# Patient Record
Sex: Female | Born: 1965 | Race: White | Hispanic: Yes | Marital: Married | Smoking: Never smoker
Health system: Southern US, Community
[De-identification: ages and names within clinical notes are randomized; demographics above are authoritative.]

## PROBLEM LIST (undated history)

## (undated) DIAGNOSIS — K219 Gastro-esophageal reflux disease without esophagitis: Secondary | ICD-10-CM

## (undated) DIAGNOSIS — I10 Essential (primary) hypertension: Secondary | ICD-10-CM

## (undated) DIAGNOSIS — E119 Type 2 diabetes mellitus without complications: Secondary | ICD-10-CM

---

## 2004-07-08 ENCOUNTER — Ambulatory Visit (HOSPITAL_COMMUNITY): Admission: RE | Admit: 2004-07-08 | Discharge: 2004-07-08 | Payer: Self-pay | Admitting: General Surgery

## 2004-10-03 ENCOUNTER — Inpatient Hospital Stay (HOSPITAL_COMMUNITY): Admission: AD | Admit: 2004-10-03 | Discharge: 2004-10-04 | Payer: Self-pay | Admitting: Obstetrics and Gynecology

## 2005-06-27 ENCOUNTER — Ambulatory Visit (HOSPITAL_COMMUNITY): Admission: AD | Admit: 2005-06-27 | Discharge: 2005-06-27 | Payer: Self-pay | Admitting: Obstetrics and Gynecology

## 2005-07-04 ENCOUNTER — Ambulatory Visit (HOSPITAL_COMMUNITY): Admission: AD | Admit: 2005-07-04 | Discharge: 2005-07-04 | Payer: Self-pay | Admitting: Obstetrics and Gynecology

## 2005-07-11 ENCOUNTER — Ambulatory Visit (HOSPITAL_COMMUNITY): Admission: AD | Admit: 2005-07-11 | Discharge: 2005-07-11 | Payer: Self-pay | Admitting: Obstetrics and Gynecology

## 2005-07-18 ENCOUNTER — Ambulatory Visit (HOSPITAL_COMMUNITY): Admission: AD | Admit: 2005-07-18 | Discharge: 2005-07-18 | Payer: Self-pay | Admitting: Obstetrics and Gynecology

## 2005-07-25 ENCOUNTER — Ambulatory Visit (HOSPITAL_COMMUNITY): Admission: AD | Admit: 2005-07-25 | Discharge: 2005-07-25 | Payer: Self-pay | Admitting: Obstetrics and Gynecology

## 2005-07-30 ENCOUNTER — Ambulatory Visit (HOSPITAL_COMMUNITY): Admission: AD | Admit: 2005-07-30 | Discharge: 2005-07-30 | Payer: Self-pay | Admitting: Obstetrics and Gynecology

## 2005-08-01 ENCOUNTER — Ambulatory Visit (HOSPITAL_COMMUNITY): Admission: AD | Admit: 2005-08-01 | Discharge: 2005-08-01 | Payer: Self-pay | Admitting: Obstetrics and Gynecology

## 2005-08-06 ENCOUNTER — Encounter: Payer: Self-pay | Admitting: Obstetrics & Gynecology

## 2005-08-06 ENCOUNTER — Inpatient Hospital Stay (HOSPITAL_COMMUNITY): Admission: AD | Admit: 2005-08-06 | Discharge: 2005-08-10 | Payer: Self-pay | Admitting: Obstetrics & Gynecology

## 2010-01-29 ENCOUNTER — Emergency Department (HOSPITAL_COMMUNITY): Admission: EM | Admit: 2010-01-29 | Discharge: 2010-01-29 | Payer: Self-pay | Admitting: Emergency Medicine

## 2010-04-22 ENCOUNTER — Ambulatory Visit (HOSPITAL_COMMUNITY): Admission: RE | Admit: 2010-04-22 | Discharge: 2010-04-22 | Payer: Self-pay | Admitting: Family Medicine

## 2010-06-22 ENCOUNTER — Encounter: Payer: Self-pay | Admitting: General Surgery

## 2010-06-23 ENCOUNTER — Encounter: Payer: Self-pay | Admitting: General Surgery

## 2010-10-18 NOTE — Discharge Summary (Signed)
NAME:  Rebecca Ewing, Rebecca Ewing     ACCOUNT NO.:  [id number]   MEDICAL RECORD NO.:  [id number]          PATIENT TYPE:  INP   LOCATION:  A413                          FACILITY:  [facility]   PHYSICIAN:  Tilda Burrow, M.D. DATE OF BIRTH:  [birth year]   DATE OF ADMISSION:  08/06/2005  DATE OF DISCHARGE:  03/11/2007LH                                 DISCHARGE SUMMARY   ADMISSION DIAGNOSES:  1.  Pregnancy at 37 weeks' gestation.  2.  Preeclampsia, nonreassuring fetal status.   HISTORY OF PRESENT ILLNESS:  This is a 45 year old female, G2, P1 with prior  fetal demise in utero at 26 weeks for unknown reason.  She has been followed  through the office with pregnancy notable for size greater than dates with  borderline elevation of blood sugars.  She had a 1-hour glucose tolerance  test at 205 mg/percent with normal 3-hour glucose tolerance test.  She  subsequently did not show any glucosuria.  Hemoglobin A1c was normal at 6.1.  She is admitted at 24 weeks' gestation after presenting with blood pressure  elevations to the office.  Blood pressure was 170/90 with 2+ proteinuria.  She was admitted with plans to proceed towards cesarean delivery due to  unfavorable cervix.  Fetal monitoring showed nonreassuring fetal status, so  the patient was taken to the operating room for cesarean delivery.  See Dr.  Forestine Chute notes regarding cesarean delivery.   HOSPITAL COURSE:  The patient was admitted from the office with  nonreassuring fetal tracing performed.  Cesarean delivery performed finding  a large-framed female infant, Apgar's 6 and 9.  Blood gas pH on the baby was  7.12, pCO2 82.0 with pO2 14, bicarb 25.6, base excess 2.7.  There was thick  meconium which responded well to suctioning.   Maternal hemoglobin on admission was 12.1, hematocrit 35.7.  The patient had  a relatively uncomplicated postoperative course.  The baby required  supportive care with additional time in the nursery.  Mother was  converted  to hotel status on August 10, 2005, for one additional day with probable  discharge on August 11, 2005.  Removal of staples and subcutaneous Al Pimple drain was performed on the day of discharge, August 10, 2005.   FOLLOW UP:  Follow up in 1 week in our office for removal of remainder of  staples.      Tilda Burrow, M.D.  Electronically Signed     JVF/MEDQ  D:  08/10/2005  T:  08/11/2005  Job:  [id number]   cc:   [facility]

## 2010-10-18 NOTE — Op Note (Signed)
NAME:  Rebecca Ewing, Rebecca Ewing     ACCOUNT NO.:  [id number]   MEDICAL RECORD NO.:  [id number]          PATIENT TYPE:  INP   LOCATION:  A413                          FACILITY:  [facility]   PHYSICIAN:  Lazaro Arms, M.D.   DATE OF BIRTH:  07/24/1965   DATE OF PROCEDURE:  08/06/2005  DATE OF DISCHARGE:                                 OPERATIVE REPORT   PREOPERATIVE DIAGNOSES:  1.  Intrauterine pregnancy at 37-4/7 weeks' gestation.  2.  Probable preeclampsia.  3.  Advanced maternal age.  4.  Nonreassuring fetal heart rate tracing.  5.  History of intrauterine fetal demise.   POSTOPERATIVE DIAGNOSES:  1.  Intrauterine pregnancy at 37-4/7 weeks' gestation.  2.  Probable preeclampsia.  3.  Advanced maternal age.  4.  Nonreassuring fetal heart rate tracing.  5.  History of intrauterine fetal demise.  6.  Thick meconium amniotic fluid.   PROCEDURE:  Primary low transverse Cesarean section.   SURGEON:  Lazaro Arms, M.D.   ANESTHESIA:  Spinal.   FINDINGS:  Over a low transverse hysterotomy incision was delivered a viable  female. Apgars of 6 of 9 with weight to be determined in the nursery. The  infant was in the vertex presentation. There was a three-vessel cord. Cord  blood and cord gas were sent. Uterus, tubes and ovaries were normal.  Placenta was sent to pathology for evaluation. Dr. Milinda Cave was in attendance  for routine neonatal resuscitation. At the time of delivery, it was found  that the infant had thick meconium amniotic fluid, and I did a DeLee gastric  lavage x3 prior to delivery. Cord blood and cord gas were sent.   DESCRIPTION OF OPERATION:  The patient was taken to the operating room and  placed in the sitting position where she underwent a spinal anesthetic. She  was then placed in the dorsal lithotomy position with a bump in her right  hip. She was prepped and draped in usual sterile fashion. A Pfannenstiel  skin incision was made and carried down sharply to the  rectus fascia which  was scored in the midline and extended laterally. The fascia was taken off  of the muscles superiorly and inferiorly without difficulty. The muscles  were divided. The peritoneal cavity was entered. A bladder blade was placed,  and the vesicouterine serosal flap was created. A low transverse hysterotomy  incision was made, and over this incision was delivered a viable female infant  at 40 with Apgars of 6 and 9. There was a three-vessel cord. Cord blood  and cord gas were sent. The infant underwent gastric DeLee suction x3 with  approximately 6 to 7 cc of thick amniotic fluid returned. The infant was not  stimulated, and no oral nasal suctioning was performed, and the infant was  handed to Dr. Milinda Cave who was in attendance for routine neonatal  resuscitation. Dr. Milinda Cave did look below the cords and did not see anything  at that point. The uterus was exteriorized. Placenta was delivered  spontaneously and sent to pathology for evaluation. The uterus was closed in  two layers, the first being a running interlocking layer, the second being  an imbricating layer. Multiple other interrupted sutures were placed for  hemostasis. The uterus was replaced into the peritoneal cavity. The pelvis  was irrigated vigorously. All pedicles found to be hemostatic. The muscles  and peritoneum were reapproximated loosely. The fascia closed using 0 Vicryl  running. Subcutaneous tissue was made hemostatic and irrigated. The skin was  closed using skin staples. The patient tolerated the procedure well. She  experienced 750 cc of blood loss from my estimation. She received Ancef  after the cord was clamped as well as Pitocin. A JP drain was placed in the  subcutaneous tissue and anchored in the right lower quadrant where it  exited, and again the skin was closed using skin staples.      Lazaro Arms, M.D.  Electronically Signed     LHE/MEDQ  D:  08/06/2005  T:  08/07/2005  Job:   [id number]

## 2010-10-18 NOTE — Op Note (Signed)
NAME:  Rebecca Ewing, LOVAN     ACCOUNT NO.:  [id number]   MEDICAL RECORD NO.:  [id number]          PATIENT TYPE:  INP   LOCATION:  LDR4                          FACILITY:  [facility]   PHYSICIAN:  Lazaro Arms, M.D.   DATE OF BIRTH:  1966-05-30   DATE OF PROCEDURE:  10/04/2004  DATE OF DISCHARGE:                                 OPERATIVE REPORT   DELIVERY NOTE:  Jaedah got up to use the bathroom and experienced a  moderate amount of vaginal bleeding. We walked her back to the bed where  upon lying down the baby just basically plopped out. Time of birth was  08:45. Apgars were 0, 0. Weight is pending. There is obvious deformity to  the fetal head. The parents are going to get an autopsy before cremation. At  this time, 100 units of Pitocin has been injected into the umbilical vein.  She has 20 units of pitocin diluted in 1,000 cc of lactated ringers,  infusing rapidly IV. We are awaiting delivery of the placenta. There is no  bleeding at this time. The parents are appropriately grieving and bonding  with the baby.      FC/MEDQ  D:  10/04/2004  T:  10/04/2004  Job:  [id number]

## 2011-04-22 ENCOUNTER — Other Ambulatory Visit (HOSPITAL_COMMUNITY): Payer: Self-pay | Admitting: Family Medicine

## 2011-04-22 DIAGNOSIS — Z139 Encounter for screening, unspecified: Secondary | ICD-10-CM

## 2011-04-25 ENCOUNTER — Ambulatory Visit (HOSPITAL_COMMUNITY)
Admission: RE | Admit: 2011-04-25 | Discharge: 2011-04-25 | Disposition: A | Discharge status: Home / Self Care | Payer: Self-pay | Source: Ambulatory Visit | Attending: Family Medicine | Admitting: Family Medicine

## 2011-04-25 DIAGNOSIS — Z139 Encounter for screening, unspecified: Secondary | ICD-10-CM

## 2012-04-23 ENCOUNTER — Other Ambulatory Visit (HOSPITAL_COMMUNITY): Payer: Self-pay | Admitting: Nurse Practitioner

## 2012-04-23 DIAGNOSIS — N6310 Unspecified lump in the right breast, unspecified quadrant: Secondary | ICD-10-CM

## 2012-04-23 DIAGNOSIS — Z139 Encounter for screening, unspecified: Secondary | ICD-10-CM

## 2012-04-26 ENCOUNTER — Ambulatory Visit (HOSPITAL_COMMUNITY): Payer: PRIVATE HEALTH INSURANCE

## 2012-04-28 ENCOUNTER — Ambulatory Visit (HOSPITAL_COMMUNITY)
Admission: RE | Admit: 2012-04-28 | Discharge: 2012-04-28 | Disposition: A | Discharge status: Home / Self Care | Payer: PRIVATE HEALTH INSURANCE | Source: Ambulatory Visit | Attending: Nurse Practitioner | Admitting: Nurse Practitioner

## 2012-04-28 DIAGNOSIS — N6310 Unspecified lump in the right breast, unspecified quadrant: Secondary | ICD-10-CM

## 2012-04-28 DIAGNOSIS — N63 Unspecified lump in unspecified breast: Secondary | ICD-10-CM | POA: Insufficient documentation

## 2013-05-10 ENCOUNTER — Other Ambulatory Visit (HOSPITAL_COMMUNITY): Payer: Self-pay | Admitting: Nurse Practitioner

## 2013-05-10 DIAGNOSIS — Z139 Encounter for screening, unspecified: Secondary | ICD-10-CM

## 2013-05-16 ENCOUNTER — Ambulatory Visit (HOSPITAL_COMMUNITY)
Admission: RE | Admit: 2013-05-16 | Discharge: 2013-05-16 | Disposition: A | Discharge status: Home / Self Care | Payer: Self-pay | Source: Ambulatory Visit | Attending: Nurse Practitioner | Admitting: Nurse Practitioner

## 2013-05-16 DIAGNOSIS — Z139 Encounter for screening, unspecified: Secondary | ICD-10-CM

## 2014-06-22 ENCOUNTER — Other Ambulatory Visit (HOSPITAL_COMMUNITY): Payer: Self-pay | Admitting: Nurse Practitioner

## 2014-06-22 DIAGNOSIS — Z1231 Encounter for screening mammogram for malignant neoplasm of breast: Secondary | ICD-10-CM

## 2014-07-03 ENCOUNTER — Ambulatory Visit (HOSPITAL_COMMUNITY)
Admission: RE | Admit: 2014-07-03 | Discharge: 2014-07-03 | Disposition: A | Discharge status: Home / Self Care | Payer: Self-pay | Source: Ambulatory Visit | Attending: Nurse Practitioner | Admitting: Nurse Practitioner

## 2014-07-03 DIAGNOSIS — Z1231 Encounter for screening mammogram for malignant neoplasm of breast: Secondary | ICD-10-CM

## 2015-07-06 ENCOUNTER — Other Ambulatory Visit (HOSPITAL_COMMUNITY): Payer: Self-pay | Admitting: Nurse Practitioner

## 2015-07-06 DIAGNOSIS — Z1231 Encounter for screening mammogram for malignant neoplasm of breast: Secondary | ICD-10-CM

## 2015-07-16 ENCOUNTER — Ambulatory Visit (HOSPITAL_COMMUNITY)
Admission: RE | Admit: 2015-07-16 | Discharge: 2015-07-16 | Disposition: A | Discharge status: Home / Self Care | Payer: Self-pay | Source: Ambulatory Visit | Attending: Nurse Practitioner | Admitting: Nurse Practitioner

## 2015-07-16 DIAGNOSIS — Z1231 Encounter for screening mammogram for malignant neoplasm of breast: Secondary | ICD-10-CM

## 2016-07-07 ENCOUNTER — Other Ambulatory Visit (HOSPITAL_COMMUNITY): Payer: Self-pay | Admitting: Nurse Practitioner

## 2016-07-07 DIAGNOSIS — Z1231 Encounter for screening mammogram for malignant neoplasm of breast: Secondary | ICD-10-CM

## 2016-07-21 ENCOUNTER — Ambulatory Visit (HOSPITAL_COMMUNITY)
Admission: RE | Admit: 2016-07-21 | Discharge: 2016-07-21 | Disposition: A | Discharge status: Home / Self Care | Payer: Self-pay | Source: Ambulatory Visit | Attending: Nurse Practitioner | Admitting: Nurse Practitioner

## 2016-07-21 DIAGNOSIS — Z1231 Encounter for screening mammogram for malignant neoplasm of breast: Secondary | ICD-10-CM

## 2017-12-22 ENCOUNTER — Other Ambulatory Visit (HOSPITAL_COMMUNITY): Payer: Self-pay | Admitting: *Deleted

## 2017-12-22 DIAGNOSIS — Z1231 Encounter for screening mammogram for malignant neoplasm of breast: Secondary | ICD-10-CM

## 2018-01-04 ENCOUNTER — Ambulatory Visit (HOSPITAL_COMMUNITY)
Admission: RE | Admit: 2018-01-04 | Discharge: 2018-01-04 | Disposition: A | Discharge status: Home / Self Care | Payer: PRIVATE HEALTH INSURANCE | Source: Ambulatory Visit | Attending: *Deleted | Admitting: *Deleted

## 2018-01-04 DIAGNOSIS — Z1231 Encounter for screening mammogram for malignant neoplasm of breast: Secondary | ICD-10-CM | POA: Diagnosis present

## 2018-12-13 ENCOUNTER — Other Ambulatory Visit (HOSPITAL_COMMUNITY): Payer: Self-pay | Admitting: Nurse Practitioner

## 2018-12-13 DIAGNOSIS — Z1231 Encounter for screening mammogram for malignant neoplasm of breast: Secondary | ICD-10-CM

## 2019-01-17 ENCOUNTER — Other Ambulatory Visit: Payer: Self-pay

## 2019-01-17 ENCOUNTER — Ambulatory Visit (HOSPITAL_COMMUNITY)
Admission: RE | Admit: 2019-01-17 | Discharge: 2019-01-17 | Disposition: A | Discharge status: Home / Self Care | Payer: Self-pay | Source: Ambulatory Visit | Attending: Nurse Practitioner | Admitting: Nurse Practitioner

## 2019-01-17 ENCOUNTER — Encounter (HOSPITAL_COMMUNITY): Payer: Self-pay

## 2019-01-17 ENCOUNTER — Ambulatory Visit (HOSPITAL_COMMUNITY): Payer: Self-pay

## 2019-01-17 DIAGNOSIS — Z1231 Encounter for screening mammogram for malignant neoplasm of breast: Secondary | ICD-10-CM | POA: Insufficient documentation

## 2019-02-08 ENCOUNTER — Other Ambulatory Visit (HOSPITAL_COMMUNITY): Payer: Self-pay | Admitting: *Deleted

## 2019-02-08 DIAGNOSIS — R928 Other abnormal and inconclusive findings on diagnostic imaging of breast: Secondary | ICD-10-CM

## 2019-02-08 DIAGNOSIS — N6310 Unspecified lump in the right breast, unspecified quadrant: Secondary | ICD-10-CM

## 2019-02-22 ENCOUNTER — Ambulatory Visit (HOSPITAL_COMMUNITY)
Admission: RE | Admit: 2019-02-22 | Discharge: 2019-02-22 | Disposition: A | Discharge status: Home / Self Care | Payer: PRIVATE HEALTH INSURANCE | Source: Ambulatory Visit | Attending: *Deleted | Admitting: *Deleted

## 2019-02-22 ENCOUNTER — Other Ambulatory Visit (HOSPITAL_COMMUNITY): Payer: Self-pay

## 2019-02-22 ENCOUNTER — Other Ambulatory Visit: Payer: Self-pay

## 2019-02-22 DIAGNOSIS — R928 Other abnormal and inconclusive findings on diagnostic imaging of breast: Secondary | ICD-10-CM

## 2019-02-22 DIAGNOSIS — N6310 Unspecified lump in the right breast, unspecified quadrant: Secondary | ICD-10-CM | POA: Diagnosis present

## 2019-08-22 ENCOUNTER — Other Ambulatory Visit (HOSPITAL_COMMUNITY): Payer: Self-pay | Admitting: *Deleted

## 2019-08-22 DIAGNOSIS — N6310 Unspecified lump in the right breast, unspecified quadrant: Secondary | ICD-10-CM

## 2019-10-25 ENCOUNTER — Other Ambulatory Visit: Payer: Self-pay

## 2019-10-25 ENCOUNTER — Ambulatory Visit (HOSPITAL_COMMUNITY)
Admission: RE | Admit: 2019-10-25 | Discharge: 2019-10-25 | Disposition: A | Discharge status: Home / Self Care | Payer: PRIVATE HEALTH INSURANCE | Source: Ambulatory Visit | Attending: *Deleted | Admitting: *Deleted

## 2019-10-25 ENCOUNTER — Other Ambulatory Visit (HOSPITAL_COMMUNITY): Payer: PRIVATE HEALTH INSURANCE

## 2019-10-25 DIAGNOSIS — N6310 Unspecified lump in the right breast, unspecified quadrant: Secondary | ICD-10-CM

## 2019-10-27 ENCOUNTER — Other Ambulatory Visit (HOSPITAL_COMMUNITY): Payer: Self-pay

## 2022-01-05 IMAGING — US US BREAST*R* LIMITED INC AXILLA
1 series · 7 of 7 positions shown · non-contrast
Comparison: Previous exams.

CLINICAL DATA: Short-term follow-up for probably benign right
breast mass.

EXAM:
ULTRASOUND OF THE RIGHT BREAST

[Series 1: us breast*right* limited inc axilla · 0.05mm/px · 7 of 7 slices shown]
[im 1/7]
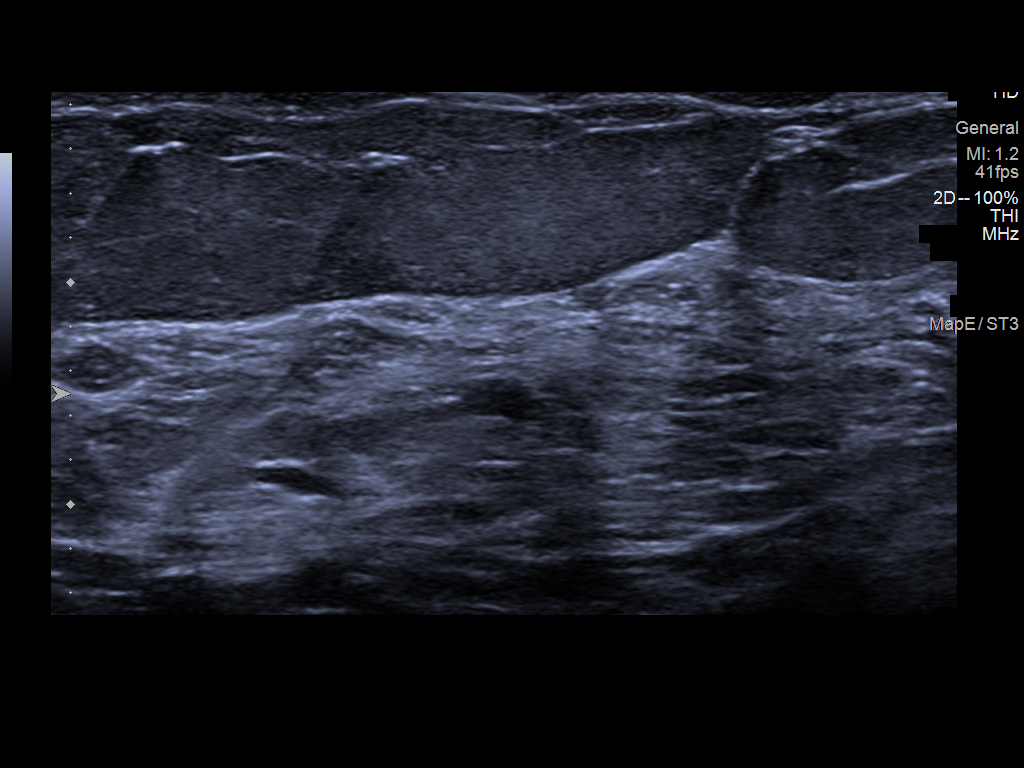
[im 2/7]
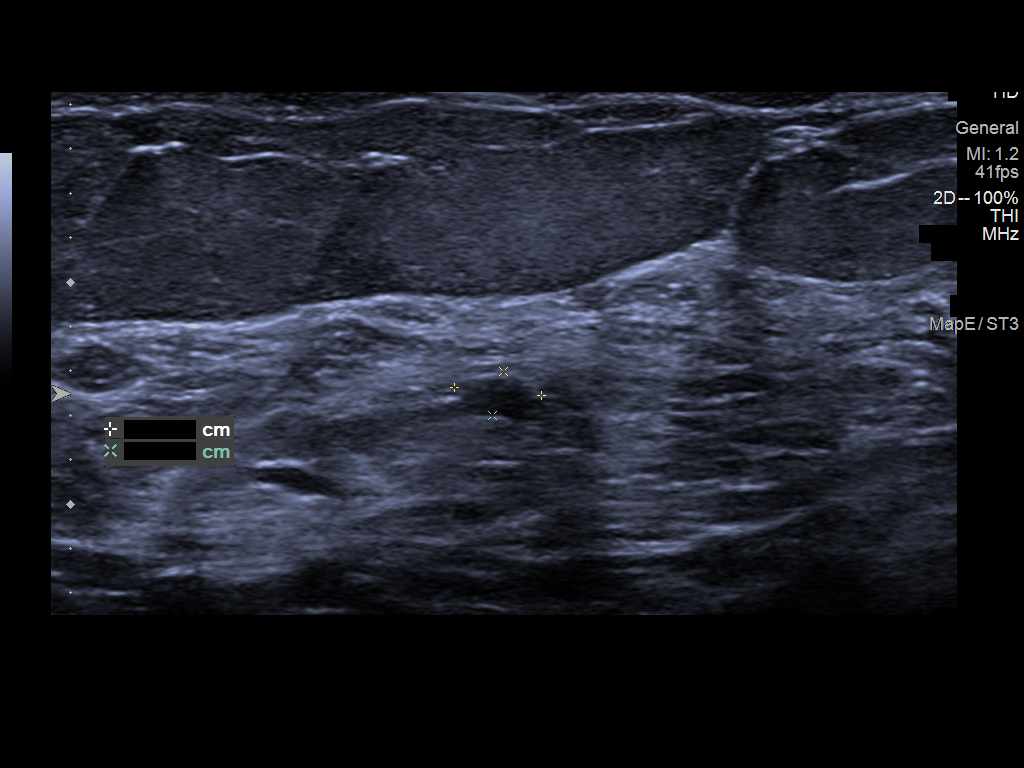
[im 3/7]
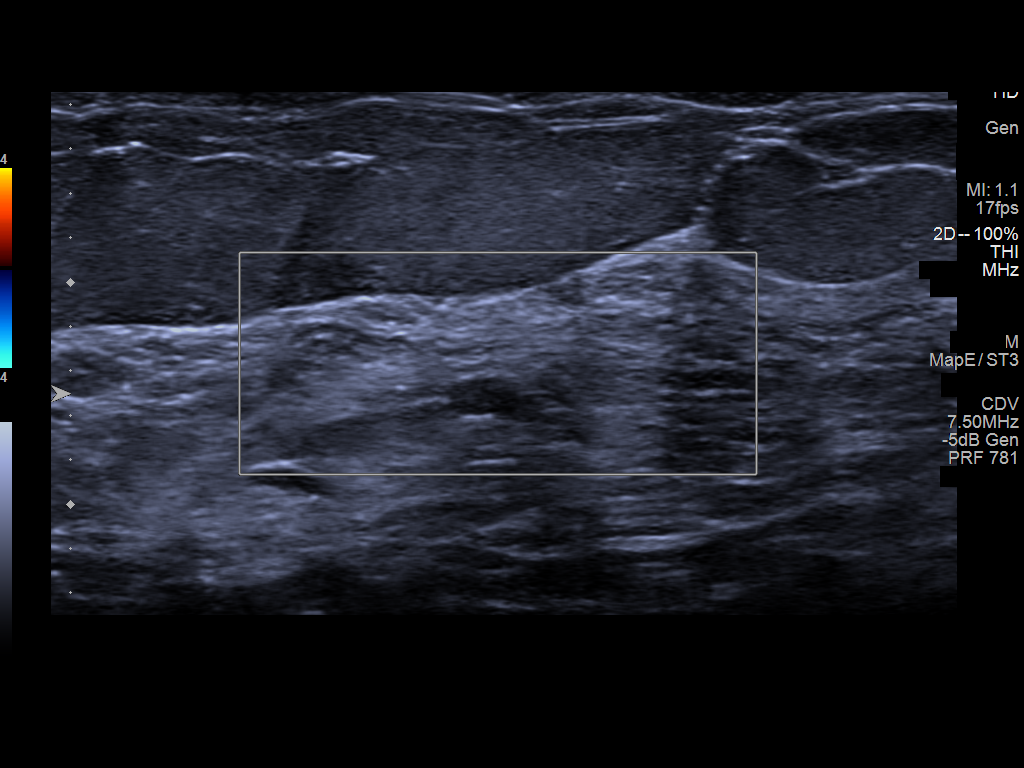
[im 4/7]
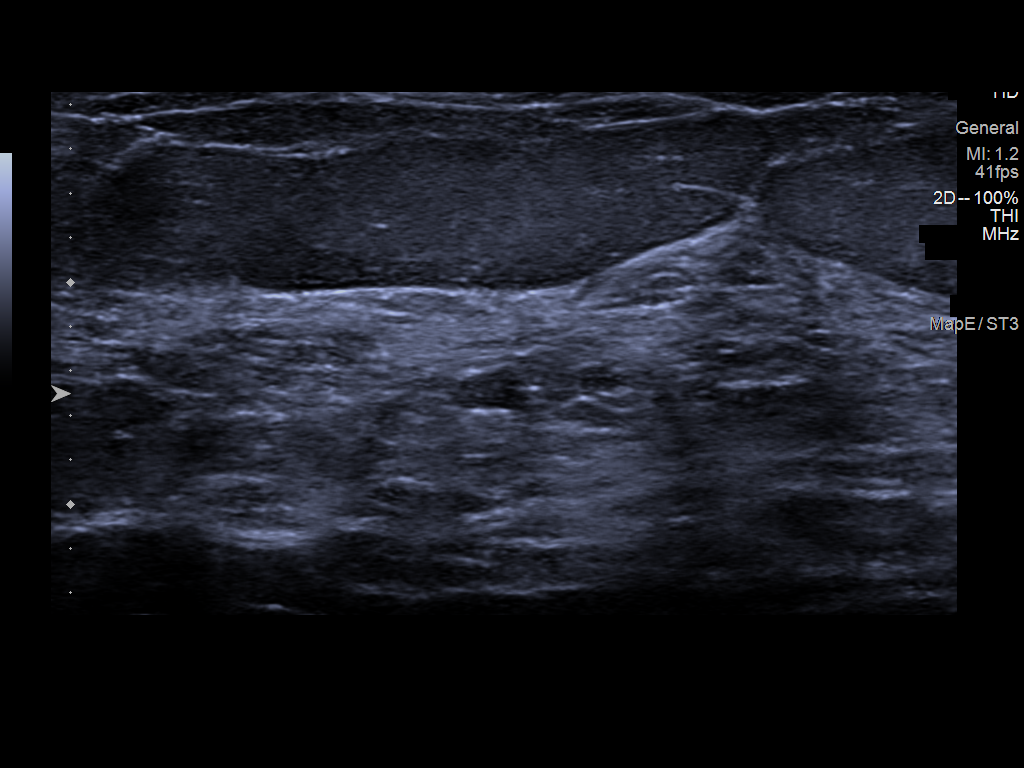
[im 5/7]
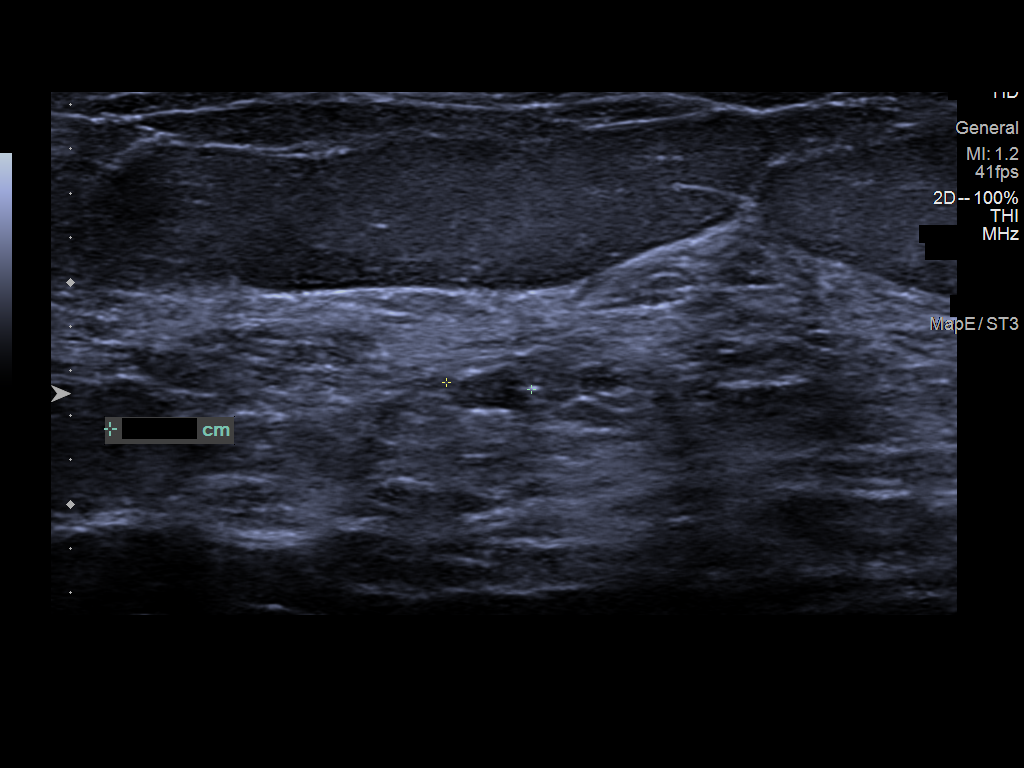
[im 6/7]
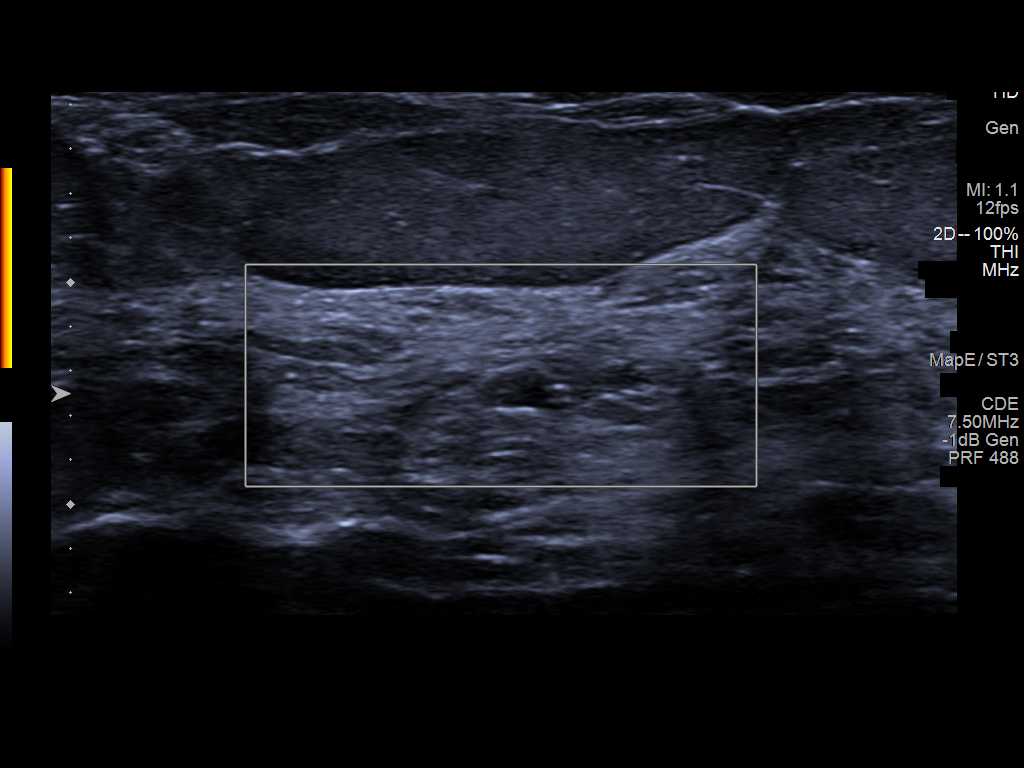
[im 7/7]
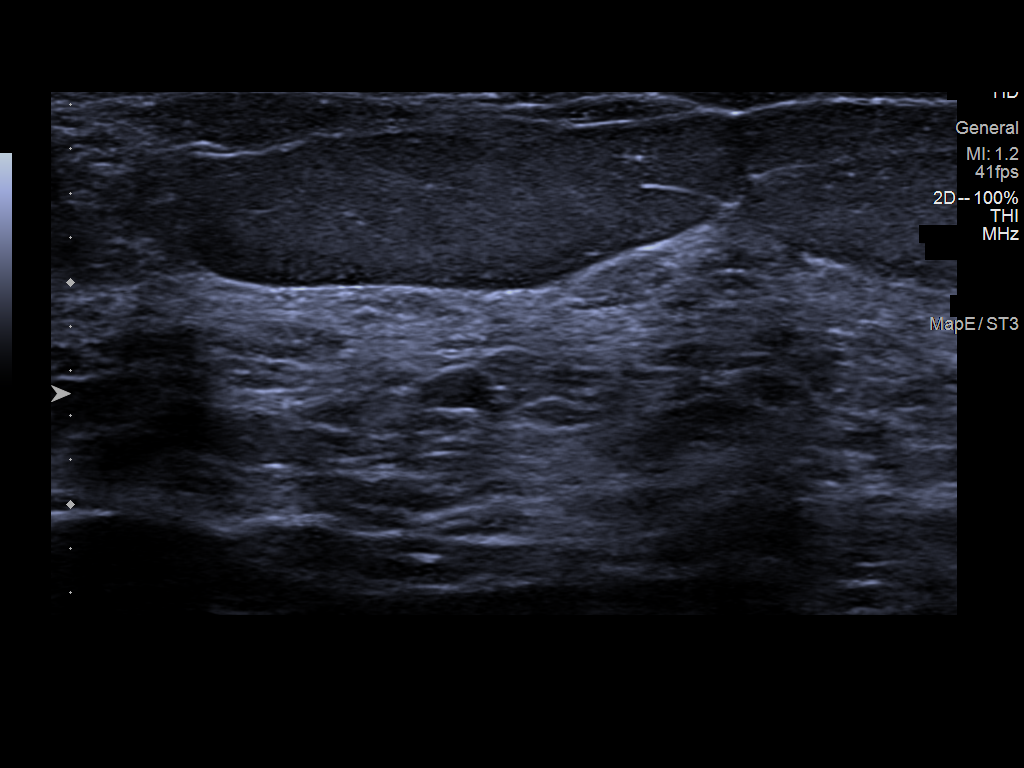

[7 of 7 positions shown; findings below may reference images not displayed]

FINDINGS: Targeted ultrasound of the upper-outer right breast was performed.
The oval circumscribed hypoechoic mass at 10 o'clock 5 cm from
nipple measures 0.4 x 0.2 x 0.4 cm, previously measured 0.7 x 0.5 x
0.9 cm. This is considered benign given interval decrease in size.
IMPRESSION: Benign right breast mass.

RECOMMENDATION:
Recommend annual routine screening mammography, due February 2020.

I have discussed the findings and recommendations with the patient.
If applicable, a reminder letter will be sent to the patient
regarding the next appointment.

BI-RADS CATEGORY  2: Benign.

## 2022-06-08 ENCOUNTER — Emergency Department (HOSPITAL_COMMUNITY)
Admission: EM | Admit: 2022-06-08 | Discharge: 2022-06-08 | Disposition: A | Discharge status: Home / Self Care | Payer: Self-pay | Attending: Emergency Medicine | Admitting: Emergency Medicine

## 2022-06-08 ENCOUNTER — Encounter (HOSPITAL_COMMUNITY): Payer: Self-pay | Admitting: *Deleted

## 2022-06-08 ENCOUNTER — Other Ambulatory Visit: Payer: Self-pay

## 2022-06-08 ENCOUNTER — Emergency Department (HOSPITAL_COMMUNITY): Payer: Self-pay

## 2022-06-08 DIAGNOSIS — R799 Abnormal finding of blood chemistry, unspecified: Secondary | ICD-10-CM | POA: Insufficient documentation

## 2022-06-08 DIAGNOSIS — R1010 Upper abdominal pain, unspecified: Secondary | ICD-10-CM

## 2022-06-08 DIAGNOSIS — K8020 Calculus of gallbladder without cholecystitis without obstruction: Secondary | ICD-10-CM | POA: Insufficient documentation

## 2022-06-08 HISTORY — DX: Type 2 diabetes mellitus without complications: E11.9

## 2022-06-08 HISTORY — DX: Essential (primary) hypertension: I10

## 2022-06-08 LAB — CBC
HCT: 37.8 % (ref 36.0–46.0)
Hemoglobin: 12.2 g/dL (ref 12.0–15.0)
MCH: 30 pg (ref 26.0–34.0)
MCHC: 32.3 g/dL (ref 30.0–36.0)
MCV: 93.1 fL (ref 80.0–100.0)
Platelets: 220 10*3/uL (ref 150–400)
RBC: 4.06 MIL/uL (ref 3.87–5.11)
RDW: 13 % (ref 11.5–15.5)
WBC: 11.1 10*3/uL — ABNORMAL HIGH (ref 4.0–10.5)
nRBC: 0 % (ref 0.0–0.2)

## 2022-06-08 LAB — COMPREHENSIVE METABOLIC PANEL
ALT: 46 U/L — ABNORMAL HIGH (ref 0–44)
AST: 64 U/L — ABNORMAL HIGH (ref 15–41)
Albumin: 4 g/dL (ref 3.5–5.0)
Alkaline Phosphatase: 74 U/L (ref 38–126)
Anion gap: 11 (ref 5–15)
BUN: 11 mg/dL (ref 6–20)
CO2: 27 mmol/L (ref 22–32)
Calcium: 9.4 mg/dL (ref 8.9–10.3)
Chloride: 96 mmol/L — ABNORMAL LOW (ref 98–111)
Creatinine, Ser: 0.83 mg/dL (ref 0.44–1.00)
GFR, Estimated: >60 mL/min (ref >60)
Glucose, Bld: 164 mg/dL — ABNORMAL HIGH (ref 70–99)
Potassium: 4 mmol/L (ref 3.5–5.1)
Sodium: 134 mmol/L — ABNORMAL LOW (ref 135–145)
Total Bilirubin: 1 mg/dL (ref 0.3–1.2)
Total Protein: 7.5 g/dL (ref 6.5–8.1)

## 2022-06-08 LAB — LIPASE, BLOOD: Lipase: 46 U/L (ref 11–51)

## 2022-06-08 LAB — CBG MONITORING, ED: Glucose-Capillary: 125 mg/dL — ABNORMAL HIGH (ref 70–99)

## 2022-06-08 MED ORDER — IOHEXOL 300 MG/ML  SOLN
100.0000 mL | Freq: Once | INTRAMUSCULAR | Status: AC | PRN
  Administered 2022-06-08: 100 mL via INTRAVENOUS

## 2022-06-08 MED ORDER — OXYCODONE-ACETAMINOPHEN 5-325 MG PO TABS: 1.0000 | ORAL_TABLET | Freq: Four times a day (QID) | ORAL | 0 refills | Status: DC | PRN

## 2022-06-08 MED ORDER — ONDANSETRON 4 MG PO TBDP: ORAL_TABLET | ORAL | 0 refills | Status: AC

## 2022-06-08 NOTE — ED Provider Notes (Signed)
[facility] EMERGENCY DEPARTMENT Provider Note   CSN: [id number] Arrival date & time: 06/08/22  1633     History {Add pertinent medical, surgical, social history, OB history to HPI:1} Chief Complaint  Patient presents with   Abdominal Pain    Rebecca Ewing is a 57 y.o. female.  Patient complains of right upper quadrant pain for the last few months off-and-on.  She had an episode today but it has improved   Abdominal Pain      Home Medications Prior to Admission medications   Medication Sig Start Date End Date Taking? Authorizing Provider  ondansetron (ZOFRAN-ODT) 4 MG disintegrating tablet 4mg  ODT q4 hours prn nausea/vomit 06/08/22  Yes 08/07/22, MD  oxyCODONE-acetaminophen (PERCOCET/ROXICET) 5-325 MG tablet Take 1 tablet by mouth every 6 (six) hours as needed for severe pain. 06/08/22  Yes 08/07/22, MD      Allergies    Patient has no known allergies.    Review of Systems   Review of Systems  Gastrointestinal:  Positive for abdominal pain.    Physical Exam Updated Vital Signs BP 100/65 (BP Location: Right Arm)   Pulse 73   Temp (!) 97.2 F (36.2 C) (Temporal)   Resp 18   Wt 87.1 kg   LMP 12/21/2017   SpO2 97%  Physical Exam  ED Results / Procedures / Treatments   Labs (all labs ordered are listed, but only abnormal results are displayed) Labs Reviewed  COMPREHENSIVE METABOLIC PANEL - Abnormal; Notable for the following components:      Result Value   Sodium 134 (*)    Chloride 96 (*)    Glucose, Bld 164 (*)    AST 64 (*)    ALT 46 (*)    All other components within normal limits  CBC - Abnormal; Notable for the following components:   WBC 11.1 (*)    All other components within normal limits  CBG MONITORING, ED - Abnormal; Notable for the following components:   Glucose-Capillary 125 (*)    All other components within normal limits  LIPASE, BLOOD    EKG None  Radiology CT ABDOMEN PELVIS W CONTRAST  Result Date:  06/08/2022 CLINICAL DATA:  Epigastric pain EXAM: CT ABDOMEN AND PELVIS WITH CONTRAST TECHNIQUE: Multidetector CT imaging of the abdomen and pelvis was performed using the standard protocol following bolus administration of intravenous contrast. RADIATION DOSE REDUCTION: This exam was performed according to the departmental dose-optimization program which includes automated exposure control, adjustment of the mA and/or kV according to patient size and/or use of iterative reconstruction technique. CONTRAST:  08/07/2022 OMNIPAQUE IOHEXOL 300 MG/ML  SOLN COMPARISON:  None Available. FINDINGS: Lower chest: No acute abnormality. Hepatobiliary: Multiple gallstones are present. There is no biliary ductal dilatation. Liver appears within normal limits. Pancreas: Unremarkable. No pancreatic ductal dilatation or surrounding inflammatory changes. Spleen: Normal in size without focal abnormality. Adrenals/Urinary Tract: There is a subcentimeter cyst in the right kidney. Otherwise, kidneys, adrenal glands and bladder are within normal limits. Stomach/Bowel: Stomach is within normal limits. Appendix appears normal. No evidence of bowel wall thickening, distention, or inflammatory changes. There is sigmoid colon diverticulosis. Vascular/Lymphatic: Aortic atherosclerosis. No enlarged abdominal or pelvic lymph nodes. Reproductive: Uterus and bilateral adnexa are unremarkable. Other: No abdominal wall hernia or abnormality. No abdominopelvic ascites. Musculoskeletal: No acute or significant osseous findings. IMPRESSION: 1. No acute localizing process in the abdomen or pelvis. 2. Cholelithiasis. 3. Sigmoid colon diverticulosis. Aortic Atherosclerosis (ICD10-I70.0). Electronically Signed   By:  M.D.   On: 06/08/2022 19:51    Procedures Procedures  {Document cardiac monitor, telemetry assessment procedure when appropriate:1}  Medications Ordered in ED Medications  iohexol (OMNIPAQUE) 300 MG/ML solution 100 mL (100 mLs  Intravenous Contrast Given 06/08/22 1922)    ED Course/ Medical Decision Making/ A&P                           Medical Decision Making Amount and/or Complexity of Data Reviewed Labs: ordered.  Risk Prescription drug management.   Patient with gallstones.  She is not having any pain now.  She will be referred to general surgery and given pain medicine and nausea medicine  {Document critical care time when appropriate:1} {Document review of labs and clinical decision tools ie heart score, Chads2Vasc2 etc:1}  {Document your independent review of radiology images, and any outside records:1} {Document your discussion with family members, caretakers, and with consultants:1} {Document social determinants of health affecting pt's care:1} {Document your decision making why or why not admission, treatments were needed:1} Final Clinical Impression(s) / ED Diagnoses Final diagnoses:  Pain of upper abdomen  Gallstones    Rx / DC Orders ED Discharge Orders          Ordered    oxyCODONE-acetaminophen (PERCOCET/ROXICET) 5-325 MG tablet  Every 6 hours PRN        06/08/22 2019    ondansetron (ZOFRAN-ODT) 4 MG disintegrating tablet        06/08/22 2019

## 2022-06-08 NOTE — ED Triage Notes (Signed)
Pt with abd pain to mid right side with N/V today, denies diarrhea or fevers. Denies any recent sick contacts. No pain since last vomited, total of 3 emesis today.

## 2022-06-08 NOTE — ED Provider Triage Note (Signed)
Emergency Medicine Provider Triage Evaluation Note  Rebecca Ewing , a 57 y.o. female  was evaluated in triage.  Pt complains of a several month history of epigastric pain, was seen at an urgent care center several months ago and given a prescription for some type of antacid medication.  Symptoms improved but then returned today.  She describes sharp pain which is now radiated into her right upper quadrant.  Nausea with emesis x 3.  No diarrhea, no fevers.  History is limited by language, son and husband at bedside helping with translation initially.  Review of Systems  Positive: Abdominal pain, nausea and vomiting Negative: Fever, diarrhea  Physical Exam  BP 100/65 (BP Location: Right Arm)   Pulse 73   Temp (!) 97.2 F (36.2 C) (Temporal)   Resp 18   Wt 87.1 kg   LMP 12/21/2017   SpO2 97%  Gen:   Awake, no distress   Resp:  Normal effort  MSK:   Moves extremities without difficulty  Other:  Abdominal pain and epigastric and right upper to the mid abdomen.  No guarding, limited exam as patient is currently sitting in triage chair.  Medical Decision Making  Medically screening exam initiated at 6:12 PM.  Appropriate orders placed.  Rebecca Ewing was informed that the remainder of the evaluation will be completed by another provider, this initial triage assessment does not replace that evaluation, and the importance of remaining in the ED until their evaluation is complete.  Labs have been ordered, she denies current nausea so have deferred ordering antiemetic.  Resulted labs indicating a mild elevated transaminase levels, her lipase is negative.  CT abdomen pelvis have been ordered to further assess suspected gallbladder disease.   Rebecca Jefferson, PA-C 06/08/22 1818

## 2022-06-08 NOTE — Discharge Instructions (Signed)
Follow-up with Dr. Constance Haw or one of her partners in the next couple weeks about your gallstones.  Return if any problems

## 2022-06-10 ENCOUNTER — Ambulatory Visit (INDEPENDENT_AMBULATORY_CARE_PROVIDER_SITE_OTHER): Payer: Self-pay | Admitting: General Surgery

## 2022-06-10 ENCOUNTER — Encounter: Payer: Self-pay | Admitting: General Surgery

## 2022-06-10 VITALS — BP 125/75 | HR 85 | Temp 98.1°F | Resp 14 | Ht 60.0 in | Wt 197.0 lb

## 2022-06-10 DIAGNOSIS — K8020 Calculus of gallbladder without cholecystitis without obstruction: Secondary | ICD-10-CM

## 2022-06-10 NOTE — Patient Instructions (Signed)
Instrucciones Para Antes de la Ciruga   Su ciruga est programada para-(your procedure is scheduled on)  06/16/2022 at 1020 AM.   [facility] - (enter)    Por favor llame al [phone] si tiene algn problema en la maana de la ciruga. (please call if you have any problems the morning of surgery.)                  Recuerde: (Remember)   No coma alimentos ni tome lquidos, incluyendo agua, despus de la medianoche del  (Do not eat food or drink liquids including water after midnight on 06/05/2022.   Tome estas medicinas en la maana de la ciruga con un SORBITO de agua (take these meds the morning of surgery with a SIP of water)  Zofran (if needed), oxycodone (if needed).    Puede cepillarse los dientes en la maana de la [place]. (you may brush your teeth the morning of surgery)   No use joyas, maquillaje de ojos, lpiz labial, crema para el cuerpo o esmalte de uas oscuro. (Do not wear jewelry, eye makeup, lipstick, body lotion, or dark fingernail polish)   No puede usar desodorante. (you may wear deodorant)   Si va a ser ingresado despues de la ciruga, deje la maleta en el carro hasta que se le haya asignado una habitacin. (If you are to be admitted after surgery, leave suitcase in car until your room has been assigned.)   A los pacientes que se les d de alta el mismo da no se les permitir manejar a casa.  (Patients discharged on the day of surgery will not be allowed to drive home)   Use ropa suelta y cmoda de regreso a casa. (wear loose comfortable clothes for ride home)    Colecistectoma mnimamente invasiva, cuidados posteriores Minimally Invasive Cholecystectomy, Care After Qu puedo esperar despus del procedimiento? Despus del procedimiento, es comn [employer] siguientes sntomas: Financial risk analyst en las zonas de la [place]. Le darn medicamentos para Chief Technology Officer. Vomitar o tener nuseas. Sentir el vientre  lleno (meteorismo) o Surveyor, mining en el hombro. Esto se debe al gas que se [place] durante la [place]. Siga estas instrucciones en su casa: Medicamentos Use los medicamentos de venta libre y los recetados solamente como se lo haya indicado el mdico. Si le recetaron un antibitico, tmelo como se lo haya indicado el mdico. No deje de tomarlo aunque comience a sentirse mejor. Si se lo indican, tome medidas a fin de prevenir problemas para ir de cuerpo (estreimiento). Es posible que deba hacer lo siguiente: Product manager suficiente lquido para Radio producer pis (la orina) de color amarillo plido. Tomar medicamentos. Le dirn qu medicamentos debe tomar. Comer alimentos ricos en fibra. Entre ellos, frijoles, cereales integrales y frutas y verduras frescas. Limitar los alimentos con alto contenido de grasa y International aid/development worker. Estos incluyen alimentos fritos o dulces. Pregunte al mdico si debe evitar conducir o Chemical engineer mquinas mientras toma los medicamentos. Cuidado de la incisin  Siga las instrucciones del mdico en lo que respecta al cuidado de los cortes de la ciruga (incisiones). Asegrese de hacer lo siguiente: Lvese las manos con agua y jabn durante al menos 20 segundos antes y despus de Scientific laboratory technician  venda (vendaje). Use un desinfectante para manos si no dispone de [place] y [place]. Cmbiese la venda. Deje los puntos (suturas) o la goma para cerrar la piel en su lugar durante al menos 2 semanas. Deje colocadas las tiras de [place] a menos que se le indique que se las quite. Puede recortar los bordes de las tiras de cinta si se enrollan. No tome baos de inmersin, no practique natacin ni utilice el jacuzzi. Pregunte a su mdico si puede ducharse o darse baos de esponja. Controle la zona de la incisin todos los das para detectar signos de infeccin. Est atento a los siguientes signos: Aumento del enrojecimiento, la hinchazn o Conservation officer, historic buildings. Lquido o sangre. Calor. Pus o mal olor. Actividad Haga  reposo como se lo haya indicado el mdico. No realice actividades que requieran mucho esfuerzo. Levntese y camine un poco cada 1 a 2 horas. Pida ayuda si se siente dbil o inestable. No levante objetos que pesen ms de 10 libras (4.5 kg) o que sean ms pesados de lo que le indicaron. No practique deportes de contacto hasta que el mdico lo autorice. No retome el trabajo ni el estudio hasta que el mdico lo autorice. Retome sus actividades normales cuando el mdico le diga que es seguro. Instrucciones generales Si le administraron un sedante durante el procedimiento, no conduzca ni use mquinas hasta que el mdico le indique que es seguro [place]. Un sedante es un medicamento que ayuda a [place]. Concurra a [place]. Comunquese con un mdico si: Aparece una erupcin cutnea. Aumentan el enrojecimiento, la hinchazn o el dolor alrededor de las incisiones. Foreman incisiones. Las incisiones estn calientes al tacto. Tiene pus o percibe que sale mal olor del lugar de las incisiones. Tiene fiebre. Una o ms de las incisiones se abren. Solicite ayuda de inmediato si: Tiene dificultad para respirar. Siente dolor en el pecho. Siente dolor que empeora en la zona de los hombros. Se desmaya o se siente mareado al ponerse de pie. Tiene dolor muy intenso en el vientre (abdomen). Siente que va a vomitar o vomita y esto dura ms de Optician, dispensing. Siente dolor en la pierna. Estos sntomas pueden Sales executive. Solicite ayuda de inmediato. Llame al 911. No espere a ver si los sntomas desaparecen. No conduzca por sus propios medios [employer]. Resumen Despus de la [place], es comn sentir dolor en las zonas de la [place]. Tambin puede tener vmitos o sentir llenura en el vientre. Siga las instrucciones del mdico acerca de los medicamentos, la restriccin de actividades y el cuidado de las zonas de la [place]. No realice actividades que  requieran mucho esfuerzo. Comunquese con el mdico si tiene fiebre u otros signos de infeccin, como ms enrojecimiento, hinchazn o dolor alrededor [employer]. [place] ayuda de inmediato si tiene dolor de pecho, Social research officer, government en los hombros que va en aumento o problemas para Ambulance person. Esta informacin no tiene Marine scientist el consejo del mdico. Asegrese de hacerle al mdico cualquier pregunta que tenga. Document Revised: 12/05/2020 Document Reviewed: 12/05/2020 Elsevier Patient Education  [place] general en adultos, cuidados posteriores General Anesthesia, Adult, Care After La siguiente informacin ofrece orientacin sobre cmo cuidarse despus del procedimiento. El mdico tambin podr darle instrucciones ms especficas. Comunquese con el mdico si tiene problemas o preguntas. Qu puedo esperar despus del procedimiento? Despus del procedimiento, es normal sentir o tener lo siguiente: Dolor o Chemical engineer  de la va intravenosa (i.v.). Nuseas o vmitos. Dolor de Investment banker, operational o ronquera. Dificultad para concentrarse. Fro o escalofros. Debilidad, somnolencia o cansancio (fatiga). Malestar y Hydrologist. Estos sntomas pueden afectar partes del cuerpo que no estuvieron involucradas en la ciruga. Siga estas indicaciones en su casa: Durante el perodo de [employer] le haya indicado el mdico:  Descanse. No participe en actividades que impliquen posibles cadas o lesiones. No conduzca ni opere maquinaria. No beba alcohol. No tome somnferos ni medicamentos que causen somnolencia. No tome decisiones trascendentes ni firme documentos importantes. No cuide a nios por su cuenta. Indicaciones generales [place] suficiente lquido como para mantener la orina de color amarillo plido. Si tiene apnea del sueo, la [place] y ciertos medicamentos pueden elevar su riesgo de tener problemas respiratorios. Siga las instrucciones del mdico respecto al uso del  dispositivo para dormir: Siempre que duerma, [place] siestas que tome en [employer]. Mientras tome analgsicos recetados, medicamentos para dormir o medicamentos que producen somnolencia. Retome sus actividades normales segn lo indicado por el mdico. Pregntele al mdico qu actividades son seguras para usted. Use los medicamentos de venta libre y los recetados solamente como se lo haya indicado el mdico. No consuma ningn producto que contenga nicotina o tabaco. Estos productos incluyen cigarrillos, tabaco para Higher education careers adviser y aparatos de vapeo, como los Psychologist, sport and exercise. Estos pueden retrasar la cicatrizacin de la incisin despus de la ciruga. Si necesita ayuda para dejar de consumir esos productos, consulte al mdico. Comunquese con un mdico si: Tiene nuseas o vmitos que no mejoran con medicamentos. Vomita cada vez que come o bebe. Su dolor no se alivia con medicamentos. No puede orinar o ve [employer]. Tiene una erupcin cutnea. Tiene fiebre. Solicite ayuda de inmediato si: Tiene dificultad para respirar. Siente dolor en el pecho. Vomita sangre. Estos sntomas pueden Sales executive. Solicite ayuda de inmediato. Llame al 911. No espere a ver si los sntomas desaparecen. No conduzca por sus propios medios [employer]. Resumen Despus del procedimiento, es comn tener dolor de garganta, ronquera, nuseas, vmitos o sentir debilidad, somnolencia o fatiga. Durante el perodo de tiempo que le haya indicado el mdico, no conduzca ni use [place]. [place] ayuda de inmediato si le cuesta respirar, siente dolor en el pecho o vomita sangre. Estos sntomas pueden Sales executive. Esta informacin no tiene Marine scientist el consejo del mdico. Asegrese de hacerle al mdico cualquier pregunta que tenga. Document Revised: 09/12/2021 Document Reviewed: 09/12/2021 Elsevier Patient Education  [place].

## 2022-06-10 NOTE — Progress Notes (Signed)
[facility] History and Physical  Reason for Referral: Gallstones  Referring Physician:  ED  Chief Complaint   New Patient (Initial Visit)     Rebecca Ewing is a 57 y.o. female.  HPI: Rebecca Ewing was seen in the ED 1/7 with complaints of RUQ pain and associated nausea and some induced vomiting. ED exam and workup showed stones on CT scan. She says she has had pain in the epigastric /RUQ area for 4 years and this is getting worse in the last 2 months. She says she has to induce vomiting some. She denies any NSAID use. She was told at a free clinic they thought she had gastritis but with the worsening symptoms she decided to go to the ED.   Interrupter used.   Past Medical History:  Diagnosis Date   Diabetes mellitus without complication (HCC)    Hypertension     Past Surgical History:  Procedure Laterality Date   CESAREAN SECTION      Family History  Problem Relation Age of Onset   Diabetes Other     Social History   Tobacco Use   Smoking status: Never   Smokeless tobacco: Never  Substance Use Topics   Alcohol use: Never   Drug use: Never    Medications: I have reviewed the patient's current medications. Allergies as of 06/10/2022   No Known Allergies      Medication List        Accurate as of June 10, 2022 12:04 PM. If you have any questions, ask your nurse or doctor.          calcium carbonate 1500 (600 Ca) MG Tabs tablet Commonly known as: OSCAL Take by mouth 2 (two) times daily with a meal.   Fish Oil 1000 MG Caps Take by mouth.   lisinopril-hydrochlorothiazide 20-12.5 MG tablet Commonly known as: ZESTORETIC Take 1 tablet by mouth daily.   lovastatin 20 MG 24 hr tablet Commonly known as: ALTOPREV Take 20 mg by mouth at bedtime.   metformin 500 MG (OSM) 24 hr tablet Commonly known as: FORTAMET Take 500 mg by mouth daily with breakfast.   ondansetron 4 MG disintegrating tablet Commonly known as:  ZOFRAN-ODT 4mg  ODT q4 hours prn nausea/vomit   oxyCODONE-acetaminophen 5-325 MG tablet Commonly known as: PERCOCET/ROXICET Take 1 tablet by mouth every 6 (six) hours as needed for severe pain.         ROS:  A comprehensive review of systems was negative except for: Gastrointestinal: positive for abdominal pain and nausea  Blood pressure 125/75, pulse 85, temperature 98.1 F (36.7 C), temperature source Oral, resp. rate 14, height 5' (1.524 m), weight 197 lb (89.4 kg), last menstrual period 12/21/2017, SpO2 96 %. Physical Exam Vitals reviewed.  HENT:     Head: Normocephalic.     Nose: Nose normal.  Eyes:     Extraocular Movements: Extraocular movements intact.  Cardiovascular:     Rate and Rhythm: Normal rate.  Pulmonary:     Effort: Pulmonary effort is normal.  Abdominal:     General: There is no distension.     Palpations: Abdomen is soft.     Tenderness: There is no abdominal tenderness.  Musculoskeletal:        General: No swelling.     Cervical back: Normal range of motion.  Skin:    General: Skin is warm.  Neurological:     General: No focal deficit present.     Mental Status: She is alert and  oriented to person, place, and time.  Psychiatric:        Mood and Affect: Mood normal.        Behavior: Behavior normal.     Results: Results for orders placed or performed during the hospital encounter of 06/08/22 (from the past 48 hour(s))  POC CBG, ED     Status: Abnormal   Collection Time: 06/08/22  5:09 PM  Result Value Ref Range   Glucose-Capillary 125 (H) 70 - 99 mg/dL    Comment: Glucose reference range applies only to samples taken after fasting for at least 8 hours.  Lipase, blood     Status: None   Collection Time: 06/08/22  5:17 PM  Result Value Ref Range   Lipase 46 11 - 51 U/L    Comment: Performed at [facility], [address] [place], [place] [address]  Comprehensive metabolic panel     Status: Abnormal   Collection Time: 06/08/22  5:17 PM   Result Value Ref Range   Sodium 134 (L) 135 - 145 mmol/L   Potassium 4.0 3.5 - 5.1 mmol/L   Chloride 96 (L) 98 - 111 mmol/L   CO2 27 22 - 32 mmol/L   Glucose, Bld 164 (H) 70 - 99 mg/dL    Comment: Glucose reference range applies only to samples taken after fasting for at least 8 hours.   BUN 11 6 - 20 mg/dL   Creatinine, Ser 0.83 0.44 - 1.00 mg/dL   Calcium 9.4 8.9 - 10.3 mg/dL   Total Protein 7.5 6.5 - 8.1 g/dL   Albumin 4.0 3.5 - 5.0 g/dL   AST 64 (H) 15 - 41 U/L   ALT 46 (H) 0 - 44 U/L   Alkaline Phosphatase 74 38 - 126 U/L   Total Bilirubin 1.0 0.3 - 1.2 mg/dL   GFR, Estimated >60 >60 mL/min    Comment: (NOTE) Calculated using the CKD-EPI Creatinine Equation (2021)    Anion gap 11 5 - 15    Comment: Performed at [facility], [address], [place], [place] [address]  CBC     Status: Abnormal   Collection Time: 06/08/22  5:17 PM  Result Value Ref Range   WBC 11.1 (H) 4.0 - 10.5 K/uL   RBC 4.06 3.87 - 5.11 MIL/uL   Hemoglobin 12.2 12.0 - 15.0 g/dL   HCT 37.8 36.0 - 46.0 %   MCV 93.1 80.0 - 100.0 fL   MCH 30.0 26.0 - 34.0 pg   MCHC 32.3 30.0 - 36.0 g/dL   RDW 13.0 11.5 - 15.5 %   Platelets 220 150 - 400 K/uL   nRBC 0.0 0.0 - 0.2 %    Comment: Performed at [facility], [address] [place], [place] [address]   Personally reviewed- stones in the gallbladder  CT ABDOMEN PELVIS W CONTRAST  Result Date: 06/08/2022 CLINICAL DATA:  Epigastric pain EXAM: CT ABDOMEN AND PELVIS WITH CONTRAST TECHNIQUE: Multidetector CT imaging of the abdomen and pelvis was performed using the standard protocol following bolus administration of intravenous contrast. RADIATION DOSE REDUCTION: This exam was performed according to the departmental dose-optimization program which includes automated exposure control, adjustment of the mA and/or kV according to patient size and/or use of iterative reconstruction technique. CONTRAST:  178mL OMNIPAQUE IOHEXOL 300 MG/ML  SOLN COMPARISON:  None Available.  FINDINGS: Lower chest: No acute abnormality. Hepatobiliary: Multiple gallstones are present. There is no biliary ductal dilatation. Liver appears within normal limits. Pancreas: Unremarkable. No pancreatic ductal dilatation  or surrounding inflammatory changes. Spleen: Normal in size without focal abnormality. Adrenals/Urinary Tract: There is a subcentimeter cyst in the right kidney. Otherwise, kidneys, adrenal glands and bladder are within normal limits. Stomach/Bowel: Stomach is within normal limits. Appendix appears normal. No evidence of bowel wall thickening, distention, or inflammatory changes. There is sigmoid colon diverticulosis. Vascular/Lymphatic: Aortic atherosclerosis. No enlarged abdominal or pelvic lymph nodes. Reproductive: Uterus and bilateral adnexa are unremarkable. Other: No abdominal wall hernia or abnormality. No abdominopelvic ascites. Musculoskeletal: No acute or significant osseous findings. IMPRESSION: 1. No acute localizing process in the abdomen or pelvis. 2. Cholelithiasis. 3. Sigmoid colon diverticulosis. Aortic Atherosclerosis (ICD10-I70.0). Electronically Signed   By: Darliss Cheney M.D.   On: 06/08/2022 19:51    LFTs were mildly elevated with AST/ ALT but normal bilirubin   Assessment & Plan:  Rebecca Ewing is a 57 y.o. female with gallstones. Her symptoms sound like she is symptomatic.   -PLAN: I counseled the patient about the indication, risks and benefits of robotic assisted laparoscopic cholecystectomy.  She understands there is a very small chance for bleeding, infection, injury to normal structures (including common bile duct), conversion to open surgery, persistent symptoms, evolution of postcholecystectomy diarrhea, need for secondary interventions, anesthesia reaction, cardiopulmonary issues and other risks not specifically detailed here. I described the expected recovery, the plan for follow-up and the restrictions during the recovery phase.  All questions  were answered.  She wants to proceed.   All questions were answered to the satisfaction of the patient and family.    Lucretia Roers 06/10/2022, 12:04 PM

## 2022-06-10 NOTE — Patient Instructions (Signed)
Colecistectoma mnimamente invasiva Minimally Invasive Cholecystectomy Una colecistectoma mnimamente invasiva es una ciruga que se realiza para extirpar la vescula biliar. La vescula biliar es un rgano que tiene forma de pera y se encuentra debajo del hgado, del lado derecho del cuerpo. La vescula biliar almacena bilis, un lquido que ayuda al organismo a digerir las grasas. La colecistectoma se realiza con frecuencia para tratar la inflamacin (irritacin e hinchazn) de la vescula biliar (colecistitis). Por lo general, esta afeccin se debe a una acumulacin de clculos biliares (colelitiasis) en la vescula biliar o al estancamiento del lquido de la vescula biliar a causa de que los clculos biliares se atascan en los conductos (tubos) y obstruyen el paso de la bilis. Esto puede producir inflamacin y Engineer, mining. En los [employer], podr ser [place]. Este procedimiento se realiza a travs de pequeas incisiones en el abdomen, en lugar de una incisin grande. Tambin se denomina "ciruga laparoscpica". Se introduce un endoscopio delgado que tiene Secretary/administrator (laparoscopio) a travs de una incisin. A travs de las otras incisiones, se introducen los instrumentos quirrgicos. En algunos casos, es posible que [place] mnimamente invasiva deba cambiarse a una [place] realizada a travs de una incisin ms grande. Esta se denomina "ciruga abierta". Informe al mdico acerca de lo siguiente: Cualquier alergia que tenga. Todos los [employer] [place], incluidos vitaminas, hierbas, gotas oftlmicas, cremas y [address] de [address]. Problemas previos que usted o algn miembro de su familia hayan tenido con los anestsicos. Cualquier problema de la sangre que tenga. Cirugas a las que se haya sometido. Cualquier afeccin mdica que tenga. Si est embarazada o podra estarlo. Cules son los riesgos? En general, se trata de un procedimiento seguro. Sin embargo,  pueden ocurrir complicaciones, por ejemplo: Infeccin. Sangrado. Reacciones alrgicas a los medicamentos. Daos a las estructuras o los rganos cercanos. Un clculo biliar que queda en el conducto biliar comn. El conducto coldoco transporta la bilis desde la vescula biliar hasta el intestino delgado. Una filtracin de bilis del hgado o del conducto qustico despus de que se extirpa la vescula biliar. Qu ocurre antes del procedimiento? Medicamentos Consulte al mdico si debe hacer o no lo siguiente: Multimedia programmer o suspender los medicamentos que [place] habitualmente. Esto es muy importante si toma medicamentos para la diabetes o anticoagulantes. Tomar medicamentos como aspirina e ibuprofeno. Estos medicamentos pueden tener un efecto anticoagulante en la [employer]. No tome estos medicamentos a menos que el mdico se lo indique. Usar medicamentos de venta libre, vitaminas, hierbas y suplementos. Instrucciones generales Si va a marcharse a su casa inmediatamente despus del procedimiento, pdale a un adulto responsable que: Lo lleve a su casa desde el hospital o la clnica. No se le permitir conducir. Lo cuide durante el [employer] indiquen. No consuma ningn producto que contenga nicotina ni tabaco durante al [employer] las 4 semanas anteriores al procedimiento. Estos productos incluyen cigarrillos, tabaco para Theatre manager y aparatos de vapeo, como los Administrator, Civil Service. Si necesita ayuda para dejar de fumar, consulte al [employer]. Pregntele al mdico: Cmo se Forensic psychologist de la Leisure centre manager. Qu medidas se tomarn para evitar una infeccin. Pueden incluir: Rasurar el vello del lugar de la ciruga. Lavar la piel con un jabn antisptico. Recibir antibiticos. Qu ocurre durante el procedimiento?  Le colocarn una va intravenosa (i.v.) en una vena. Le administrarn uno de los siguientes medicamentos o ambos: Un medicamento para ayudar a Lexicographer (sedante). Un medicamento que lo har dormir  (anestesia general). Su cirujano  le har varias incisiones pequeas en el abdomen. El laparoscopio se introducir a travs de una de las pequeas incisiones. La cmara del laparoscopio enviar imgenes a un monitor que se encuentra en el quirfano. Esto permitir a su Pensions consultant del abdomen. Le inyectarn un gas en el abdomen. Esto expandir el abdomen para que el cirujano tenga ms lugar para Facilities manager. El resto del instrumental necesario para el procedimiento se introducir a travs de las otras incisiones. Se extirpar la vescula biliar a travs de una de las incisiones. Se puede examinar el conducto coldoco. Si se encuentran clculos en la va biliar, tal vez deban extirparse. Despus de la extirpacin de la vescula biliar, se cerrarn las incisiones con puntos (suturas), grapas o goma para cerrar la piel. Las incisiones pueden cubrirse con una venda (vendaje). El procedimiento puede variar segn el mdico y el hospital. Ladell Heads ocurre despus del procedimiento? Le controlarn la presin arterial, la frecuencia cardaca, la frecuencia respiratoria y Air cabin crew de oxgeno en la sangre hasta que le den el alta del hospital o la clnica. Le darn analgsicos para Human resources officer, si es necesario. Es posible que le coloquen un drenaje en la incisin. Se lo retirarn [employer] despus del procedimiento. Resumen La colecistectoma mnimamente invasiva, tambin llamada colecistectoma laparoscpica, es una ciruga que se realiza para extirpar la vescula biliar a travs de pequeas incisiones. Informe a su mdico sobre todas las otras afecciones que tenga y [place] todos los medicamentos que est usando para dichas afecciones. Antes del procedimiento, siga las instrucciones sobre cundo dejar de comer y beber y Tacy Dura cambiar o suspender medicamentos. Haga que un adulto responsable lo cuide durante el tiempo que le indiquen despus de que le den el alta del hospital o de la  clnica. Esta informacin no tiene Theme park manager el consejo del mdico. Asegrese de hacerle al mdico cualquier pregunta que tenga. Document Revised: 12/05/2020 Document Reviewed: 12/05/2020 Elsevier Patient Education  2023 Elsevier Inc.   Colelitiasis Cholelithiasis  La colelitiasis ocurre cuando se forman clculos biliares en la vescula biliar. La vescula biliar almacena bilis. La bilis es un lquido que ayuda a [employer]. La bilis puede endurecerse y transformarse en clculos biliares. Si estos causan una obstruccin, Magazine features editor (ataque de vescula biliar). Cules son las causas? Esta afeccin puede ser causada por lo siguiente: Algunas enfermedades de la sangre, como la anemia drepanoctica. Demasiada cantidad de una sustancia parecida a la grasa (colesterol) en la bilis. No tener suficiente cantidad de sales biliares en la bilis. Estas sales le ayudan al organismo a Environmental health practitioner y a Mining engineer. La vescula biliar no se vaca completamente o con suficiente frecuencia. Esto es frecuente en las mujeres embarazadas. Qu incrementa el riesgo? Los siguientes factores pueden hacer que sea ms propenso a Clinical cytogeneticist afeccin: Ser mujer. Estar embarazada muchas veces. Comer muchos alimentos fritos, grasas y carbohidratos refinados. Tener mucho sobrepeso (obesidad). Ser mayor de 40 aos de edad. Usar medicamentos con hormonas femeninas durante [employer]. Adelgazar rpidamente. Tener clculos biliares en la familia. Tener algunos problemas de [place], como diabetes, enfermedad de Crohn o enfermedad heptica. Cules son los signos o sntomas? A menudo, puede haber clculos biliares, pero sin sntomas. Estos clculos biliares se denominan clculos silenciosos. Si un clculo biliar provoca una obstruccin, puede sentir dolor repentino. El dolor: Puede estar en la parte superior derecha del vientre (abdomen). Normalmente aparece a la noche o despus de  comer. Puede durar  una hora o ms. Se puede extender hacia el hombro derecho, la espalda o el pecho. Puede sentirse Pharmacist, community, ardor o sensacin de plenitud en la parte superior del vientre (indigestin). Si la obstruccin dura ms de algunas horas, puede tener una infeccin o hinchazn. Usted puede: Sentir que va a vomitar. Vomitar. Sentirse hinchado. Tener dolor en el vientre durante 5 horas o ms. Sentir dolor con la palpacin en el vientre, a menudo en la parte superior derecha y debajo de las [place]. Tener fiebre o escalofros. Notar que la piel o la zona blanca de los ojos se vuelven amarillas (ictericia). Tener el pis (orina) oscuro o las deposiciones (heces) plidas. Cmo se trata? El tratamiento de esta afeccin depende de qu tan mal se sienta. Si tiene sntomas, puede necesitar lo siguiente: Multimedia programmer, si los sntomas no son Orlene Erm graves. No coma durante 12 a 24 horas. Beba solamente agua y lquidos claros. Comience a comer alimentos simples o claros despus de 1 o 2 das. Pruebe con caldos y galletas. Es posible que necesite medicamentos para el dolor o Social research officer, government, o ambos. Si tiene una infeccin, necesitar antibiticos. Hospitalizacin, si tiene un dolor muy intenso o una infeccin muy grave. Ciruga para extirpar la vescula biliar. Puede ser necesario si: Los clculos biliares siguen apareciendo. Tiene sntomas muy graves. Medicamentos para destruir los clculos biliares. Medicamentos: Son mejores para los clculos pequeos. Pueden usarse durante hasta 6 a 12 meses. Un procedimiento para encontrar y extraer los clculos biliares o para fragmentarlos. Siga estas instrucciones en su casa: Medicamentos Use los medicamentos de venta libre y los recetados solamente como se lo haya indicado el mdico. Si le recetaron un antibitico, tmelo como se lo haya indicado el mdico. No deje de tomar el antibitico aunque comience a sentirse  mejor. Pregntele al mdico si el medicamento recetado le impide conducir o usar [place]. Comida y bebida Beba suficiente lquido como para Theatre manager la orina de color amarillo plido. Beba agua o lquidos claros. Esto es importante cuando Tree surgeon. Consuma alimentos saludables. Elija: Menos alimentos grasos, como las comidas fritas. Menos carbohidratos refinados. Evite los panes y los cereales muy procesados, como el pan blanco y el arroz blanco. Elija cereales integrales, como el pan integral y Dwyane Luo integral. Ms Fredderick [place]. [place], las frutas frescas y los frijoles son fuentes saludables. Instrucciones generales Mantenga un peso saludable. Concurra a todas las visitas de seguimiento como se lo haya indicado el mdico. Esto es importante. Dnde buscar ms informacin [employer] of Diabetes and Digestive and Kidney Diseases (Williams Diabetes y las Enfermedades Digestivas y Renales): [email or web address] Comunquese con un mdico si: Siente dolor repentino en el costado superior derecho del vientre. El dolor podra extenderse hasta el hombro derecho, la espalda o el pecho. Le han diagnosticado clculos biliares que no presentan sntomas y tiene lo siguiente: Dolor abdominal. Molestias, ardor o sensacin de plenitud en la parte superior del abdomen. La orina es de color oscuro o tiene heces plidas. Solicite ayuda de inmediato si: Tiene dolor repentino en la parte superior derecha del abdomen y el dolor dura ms de 2 horas. Tiene dolor en el abdomen y: Dura ms de 5 horas. Sigue empeorando. Tiene fiebre o escalofros. Tiene ganas continuas de vomitar. Sigue vomitando. La piel y la parte blanca de los ojos se ponen amarillos. Resumen La colelitiasis ocurre cuando se forman clculos biliares en la vescula biliar. La causa de esta afeccin puede ser Ardelia Mems enfermedad de  la [place], una cantidad excesiva de una sustancia parecida a la grasa en la bilis o una  cantidad insuficiente de sales biliares. El tratamiento de esta afeccin depende de qu tan mal se sienta. Si tiene sntomas, no coma ni beba. Es posible que necesite tomar medicamentos. Tal vez necesite hospitalizacin si tiene un dolor muy intenso o una infeccin muy grave. Es posible que deba someterse a una ciruga si los clculos siguen apareciendo o si tiene sntomas muy graves. Esta informacin no tiene Theme park manager el consejo del mdico. Asegrese de hacerle al mdico cualquier pregunta que tenga. Document Revised: 06/24/2019 Document Reviewed: 06/24/2019 Elsevier Patient Education  2023 [employer].

## 2022-06-10 NOTE — H&P (Signed)
[facility] History and Physical  Reason for Referral: Gallstones  Referring Physician:  ED  Chief Complaint   New Patient (Initial Visit)     Rebecca Ewing is a 57 y.o. female.  HPI: Rebecca Ewing was seen in the ED 1/7 with complaints of RUQ pain and associated nausea and some induced vomiting. ED exam and workup showed stones on CT scan. She says she has had pain in the epigastric /RUQ area for 4 years and this is getting worse in the last 2 months. She says she has to induce vomiting some. She denies any NSAID use. She was told at a free clinic they thought she had gastritis but with the worsening symptoms she decided to go to the ED.   Interrupter used.   Past Medical History:  Diagnosis Date   Diabetes mellitus without complication (HCC)    Hypertension     Past Surgical History:  Procedure Laterality Date   CESAREAN SECTION      Family History  Problem Relation Age of Onset   Diabetes Other     Social History   Tobacco Use   Smoking status: Never   Smokeless tobacco: Never  Substance Use Topics   Alcohol use: Never   Drug use: Never    Medications: I have reviewed the patient's current medications. Allergies as of 06/10/2022   No Known Allergies      Medication List        Accurate as of June 10, 2022 12:04 PM. If you have any questions, ask your nurse or doctor.          calcium carbonate 1500 (600 Ca) MG Tabs tablet Commonly known as: OSCAL Take by mouth 2 (two) times daily with a meal.   Fish Oil 1000 MG Caps Take by mouth.   lisinopril-hydrochlorothiazide 20-12.5 MG tablet Commonly known as: ZESTORETIC Take 1 tablet by mouth daily.   lovastatin 20 MG 24 hr tablet Commonly known as: ALTOPREV Take 20 mg by mouth at bedtime.   metformin 500 MG (OSM) 24 hr tablet Commonly known as: FORTAMET Take 500 mg by mouth daily with breakfast.   ondansetron 4 MG disintegrating tablet Commonly known as:  ZOFRAN-ODT 4mg  ODT q4 hours prn nausea/vomit   oxyCODONE-acetaminophen 5-325 MG tablet Commonly known as: PERCOCET/ROXICET Take 1 tablet by mouth every 6 (six) hours as needed for severe pain.         ROS:  A comprehensive review of systems was negative except for: Gastrointestinal: positive for abdominal pain and nausea  Blood pressure 125/75, pulse 85, temperature 98.1 F (36.7 C), temperature source Oral, resp. rate 14, height 5' (1.524 m), weight 197 lb (89.4 kg), last menstrual period 12/21/2017, SpO2 96 %. Physical Exam Vitals reviewed.  HENT:     Head: Normocephalic.     Nose: Nose normal.  Eyes:     Extraocular Movements: Extraocular movements intact.  Cardiovascular:     Rate and Rhythm: Normal rate.  Pulmonary:     Effort: Pulmonary effort is normal.  Abdominal:     General: There is no distension.     Palpations: Abdomen is soft.     Tenderness: There is no abdominal tenderness.  Musculoskeletal:        General: No swelling.     Cervical back: Normal range of motion.  Skin:    General: Skin is warm.  Neurological:     General: No focal deficit present.     Mental Status: She is alert and  oriented to person, place, and time.  Psychiatric:        Mood and Affect: Mood normal.        Behavior: Behavior normal.     Results: Results for orders placed or performed during the hospital encounter of 06/08/22 (from the past 48 hour(s))  POC CBG, ED     Status: Abnormal   Collection Time: 06/08/22  5:09 PM  Result Value Ref Range   Glucose-Capillary 125 (H) 70 - 99 mg/dL    Comment: Glucose reference range applies only to samples taken after fasting for at least 8 hours.  Lipase, blood     Status: None   Collection Time: 06/08/22  5:17 PM  Result Value Ref Range   Lipase 46 11 - 51 U/L    Comment: Performed at [facility], [address] [place], [place] [address]  Comprehensive metabolic panel     Status: Abnormal   Collection Time: 06/08/22  5:17 PM   Result Value Ref Range   Sodium 134 (L) 135 - 145 mmol/L   Potassium 4.0 3.5 - 5.1 mmol/L   Chloride 96 (L) 98 - 111 mmol/L   CO2 27 22 - 32 mmol/L   Glucose, Bld 164 (H) 70 - 99 mg/dL    Comment: Glucose reference range applies only to samples taken after fasting for at least 8 hours.   BUN 11 6 - 20 mg/dL   Creatinine, Ser 0.83 0.44 - 1.00 mg/dL   Calcium 9.4 8.9 - 10.3 mg/dL   Total Protein 7.5 6.5 - 8.1 g/dL   Albumin 4.0 3.5 - 5.0 g/dL   AST 64 (H) 15 - 41 U/L   ALT 46 (H) 0 - 44 U/L   Alkaline Phosphatase 74 38 - 126 U/L   Total Bilirubin 1.0 0.3 - 1.2 mg/dL   GFR, Estimated >60 >60 mL/min    Comment: (NOTE) Calculated using the CKD-EPI Creatinine Equation (2021)    Anion gap 11 5 - 15    Comment: Performed at [facility], [address], [place], [place] [address]  CBC     Status: Abnormal   Collection Time: 06/08/22  5:17 PM  Result Value Ref Range   WBC 11.1 (H) 4.0 - 10.5 K/uL   RBC 4.06 3.87 - 5.11 MIL/uL   Hemoglobin 12.2 12.0 - 15.0 g/dL   HCT 37.8 36.0 - 46.0 %   MCV 93.1 80.0 - 100.0 fL   MCH 30.0 26.0 - 34.0 pg   MCHC 32.3 30.0 - 36.0 g/dL   RDW 13.0 11.5 - 15.5 %   Platelets 220 150 - 400 K/uL   nRBC 0.0 0.0 - 0.2 %    Comment: Performed at [facility], [address] [place], [place] [address]   Personally reviewed- stones in the gallbladder  CT ABDOMEN PELVIS W CONTRAST  Result Date: 06/08/2022 CLINICAL DATA:  Epigastric pain EXAM: CT ABDOMEN AND PELVIS WITH CONTRAST TECHNIQUE: Multidetector CT imaging of the abdomen and pelvis was performed using the standard protocol following bolus administration of intravenous contrast. RADIATION DOSE REDUCTION: This exam was performed according to the departmental dose-optimization program which includes automated exposure control, adjustment of the mA and/or kV according to patient size and/or use of iterative reconstruction technique. CONTRAST:  178mL OMNIPAQUE IOHEXOL 300 MG/ML  SOLN COMPARISON:  None Available.  FINDINGS: Lower chest: No acute abnormality. Hepatobiliary: Multiple gallstones are present. There is no biliary ductal dilatation. Liver appears within normal limits. Pancreas: Unremarkable. No pancreatic ductal dilatation  or surrounding inflammatory changes. Spleen: Normal in size without focal abnormality. Adrenals/Urinary Tract: There is a subcentimeter cyst in the right kidney. Otherwise, kidneys, adrenal glands and bladder are within normal limits. Stomach/Bowel: Stomach is within normal limits. Appendix appears normal. No evidence of bowel wall thickening, distention, or inflammatory changes. There is sigmoid colon diverticulosis. Vascular/Lymphatic: Aortic atherosclerosis. No enlarged abdominal or pelvic lymph nodes. Reproductive: Uterus and bilateral adnexa are unremarkable. Other: No abdominal wall hernia or abnormality. No abdominopelvic ascites. Musculoskeletal: No acute or significant osseous findings. IMPRESSION: 1. No acute localizing process in the abdomen or pelvis. 2. Cholelithiasis. 3. Sigmoid colon diverticulosis. Aortic Atherosclerosis (ICD10-I70.0). Electronically Signed   By: Amy  Guttmann M.D.   On: 06/08/2022 19:51    LFTs were mildly elevated with AST/ ALT but normal bilirubin   Assessment & Plan:  Rebecca Ewing is a 56 y.o. female with gallstones. Her symptoms sound like she is symptomatic.   -PLAN: I counseled the patient about the indication, risks and benefits of robotic assisted laparoscopic cholecystectomy.  She understands there is a very small chance for bleeding, infection, injury to normal structures (including common bile duct), conversion to open surgery, persistent symptoms, evolution of postcholecystectomy diarrhea, need for secondary interventions, anesthesia reaction, cardiopulmonary issues and other risks not specifically detailed here. I described the expected recovery, the plan for follow-up and the restrictions during the recovery phase.  All questions  were answered.  She wants to proceed.   All questions were answered to the satisfaction of the patient and family.    Scotti Kosta C Malasha Kleppe 06/10/2022, 12:04 PM       

## 2022-06-12 ENCOUNTER — Encounter (HOSPITAL_COMMUNITY)
Admission: RE | Admit: 2022-06-12 | Discharge: 2022-06-12 | Disposition: A | Discharge status: Home / Self Care | Payer: PRIVATE HEALTH INSURANCE | Source: Ambulatory Visit | Attending: General Surgery | Admitting: General Surgery

## 2022-06-12 ENCOUNTER — Encounter (HOSPITAL_COMMUNITY): Payer: Self-pay

## 2022-06-12 VITALS — BP 110/48 | HR 82 | Temp 98.1°F | Resp 18 | Ht 60.0 in | Wt 197.0 lb

## 2022-06-12 DIAGNOSIS — E119 Type 2 diabetes mellitus without complications: Secondary | ICD-10-CM | POA: Insufficient documentation

## 2022-06-12 DIAGNOSIS — Z01818 Encounter for other preprocedural examination: Secondary | ICD-10-CM | POA: Insufficient documentation

## 2022-06-12 DIAGNOSIS — I10 Essential (primary) hypertension: Secondary | ICD-10-CM | POA: Insufficient documentation

## 2022-06-12 HISTORY — DX: Gastro-esophageal reflux disease without esophagitis: K21.9

## 2022-06-13 LAB — HEMOGLOBIN A1C
Hgb A1c MFr Bld: 6.5 % — ABNORMAL HIGH (ref 4.8–5.6)
Mean Plasma Glucose: 140 mg/dL

## 2022-06-16 ENCOUNTER — Ambulatory Visit (HOSPITAL_COMMUNITY): Payer: Self-pay | Admitting: Anesthesiology

## 2022-06-16 ENCOUNTER — Ambulatory Visit (HOSPITAL_BASED_OUTPATIENT_CLINIC_OR_DEPARTMENT_OTHER): Payer: Self-pay | Admitting: Anesthesiology

## 2022-06-16 ENCOUNTER — Encounter (HOSPITAL_COMMUNITY)
Admission: RE | Disposition: A | Discharge status: Home / Self Care | Payer: Self-pay | Source: Home / Self Care | Attending: General Surgery

## 2022-06-16 ENCOUNTER — Other Ambulatory Visit: Payer: Self-pay

## 2022-06-16 ENCOUNTER — Ambulatory Visit (HOSPITAL_COMMUNITY)
Admission: RE | Admit: 2022-06-16 | Discharge: 2022-06-16 | Disposition: A | Discharge status: Home / Self Care | Payer: Self-pay | Attending: General Surgery | Admitting: General Surgery

## 2022-06-16 DIAGNOSIS — K8020 Calculus of gallbladder without cholecystitis without obstruction: Secondary | ICD-10-CM

## 2022-06-16 DIAGNOSIS — I10 Essential (primary) hypertension: Secondary | ICD-10-CM | POA: Insufficient documentation

## 2022-06-16 DIAGNOSIS — K5730 Diverticulosis of large intestine without perforation or abscess without bleeding: Secondary | ICD-10-CM | POA: Insufficient documentation

## 2022-06-16 DIAGNOSIS — I700 Atherosclerosis of aorta: Secondary | ICD-10-CM | POA: Insufficient documentation

## 2022-06-16 DIAGNOSIS — N281 Cyst of kidney, acquired: Secondary | ICD-10-CM | POA: Insufficient documentation

## 2022-06-16 DIAGNOSIS — E119 Type 2 diabetes mellitus without complications: Secondary | ICD-10-CM | POA: Insufficient documentation

## 2022-06-16 DIAGNOSIS — Z7984 Long term (current) use of oral hypoglycemic drugs: Secondary | ICD-10-CM | POA: Insufficient documentation

## 2022-06-16 DIAGNOSIS — K219 Gastro-esophageal reflux disease without esophagitis: Secondary | ICD-10-CM | POA: Insufficient documentation

## 2022-06-16 DIAGNOSIS — Z01818 Encounter for other preprocedural examination: Secondary | ICD-10-CM

## 2022-06-16 DIAGNOSIS — K8010 Calculus of gallbladder with chronic cholecystitis without obstruction: Secondary | ICD-10-CM | POA: Insufficient documentation

## 2022-06-16 LAB — GLUCOSE, CAPILLARY
Glucose-Capillary: 104 mg/dL — ABNORMAL HIGH (ref 70–99)
Glucose-Capillary: 144 mg/dL — ABNORMAL HIGH (ref 70–99)

## 2022-06-16 SURGERY — CHOLECYSTECTOMY, ROBOT-ASSISTED, LAPAROSCOPIC
Anesthesia: General | Site: Abdomen

## 2022-06-16 MED ORDER — FENTANYL CITRATE PF 50 MCG/ML IJ SOSY
25.0000 ug | PREFILLED_SYRINGE | INTRAMUSCULAR | Status: DC | PRN
  Administered 2022-06-16: 50 ug via INTRAVENOUS
  Filled 2022-06-16: qty 1

## 2022-06-16 MED ORDER — STERILE WATER FOR IRRIGATION IR SOLN
Status: DC | PRN
  Administered 2022-06-16: 500 mL

## 2022-06-16 MED ORDER — PROPOFOL 10 MG/ML IV BOLUS
INTRAVENOUS | Status: DC | PRN
  Administered 2022-06-16: 200 mg via INTRAVENOUS

## 2022-06-16 MED ORDER — CHLORHEXIDINE GLUCONATE CLOTH 2 % EX PADS: 6.0000 | MEDICATED_PAD | Freq: Once | CUTANEOUS | Status: DC

## 2022-06-16 MED ORDER — BUPIVACAINE LIPOSOME 1.3 % IJ SUSP
INTRAMUSCULAR | Status: AC
  Filled 2022-06-16: qty 20

## 2022-06-16 MED ORDER — ONDANSETRON HCL 4 MG/2ML IJ SOLN: 4.0000 mg | Freq: Once | INTRAMUSCULAR | Status: DC | PRN

## 2022-06-16 MED ORDER — SUGAMMADEX SODIUM 500 MG/5ML IV SOLN
INTRAVENOUS | Status: DC | PRN
  Administered 2022-06-16: 200 mg via INTRAVENOUS

## 2022-06-16 MED ORDER — ONDANSETRON HCL 4 MG/2ML IJ SOLN
INTRAMUSCULAR | Status: AC
  Filled 2022-06-16: qty 2

## 2022-06-16 MED ORDER — PROPOFOL 10 MG/ML IV BOLUS
INTRAVENOUS | Status: AC
  Filled 2022-06-16: qty 20

## 2022-06-16 MED ORDER — SODIUM CHLORIDE 0.9 % IV SOLN
INTRAVENOUS | Status: AC
  Filled 2022-06-16: qty 2

## 2022-06-16 MED ORDER — FENTANYL CITRATE (PF) 100 MCG/2ML IJ SOLN
INTRAMUSCULAR | Status: DC | PRN
  Administered 2022-06-16: 50 ug via INTRAVENOUS
  Administered 2022-06-16: 100 ug via INTRAVENOUS
  Administered 2022-06-16: 50 ug via INTRAVENOUS

## 2022-06-16 MED ORDER — LIDOCAINE HCL (PF) 2 % IJ SOLN
INTRAMUSCULAR | Status: AC
  Filled 2022-06-16: qty 5

## 2022-06-16 MED ORDER — BUPIVACAINE LIPOSOME 1.3 % IJ SUSP
INTRAMUSCULAR | Status: DC | PRN
  Administered 2022-06-16: 20 mL

## 2022-06-16 MED ORDER — FENTANYL CITRATE (PF) 250 MCG/5ML IJ SOLN
INTRAMUSCULAR | Status: AC
  Filled 2022-06-16: qty 5

## 2022-06-16 MED ORDER — ROCURONIUM BROMIDE 100 MG/10ML IV SOLN
INTRAVENOUS | Status: DC | PRN
  Administered 2022-06-16: 60 mg via INTRAVENOUS

## 2022-06-16 MED ORDER — CHLORHEXIDINE GLUCONATE CLOTH 2 % EX PADS
6.0000 | MEDICATED_PAD | Freq: Once | CUTANEOUS | Status: AC
  Administered 2022-06-16: 6 via TOPICAL

## 2022-06-16 MED ORDER — PHENYLEPHRINE HCL (PRESSORS) 10 MG/ML IV SOLN
INTRAVENOUS | Status: DC | PRN
  Administered 2022-06-16 (×3): 80 ug via INTRAVENOUS

## 2022-06-16 MED ORDER — PHENYLEPHRINE 80 MCG/ML (10ML) SYRINGE FOR IV PUSH (FOR BLOOD PRESSURE SUPPORT)
PREFILLED_SYRINGE | INTRAVENOUS | Status: AC
  Filled 2022-06-16: qty 10

## 2022-06-16 MED ORDER — KETOROLAC TROMETHAMINE 30 MG/ML IJ SOLN
30.0000 mg | Freq: Once | INTRAMUSCULAR | Status: AC
  Administered 2022-06-16: 30 mg via INTRAVENOUS
  Filled 2022-06-16: qty 1

## 2022-06-16 MED ORDER — EPHEDRINE SULFATE (PRESSORS) 50 MG/ML IJ SOLN
INTRAMUSCULAR | Status: DC | PRN
  Administered 2022-06-16: 10 mg via INTRAVENOUS

## 2022-06-16 MED ORDER — LACTATED RINGERS IV SOLN: INTRAVENOUS | Status: DC

## 2022-06-16 MED ORDER — INDOCYANINE GREEN 25 MG IV SOLR
INTRAVENOUS | Status: AC
  Filled 2022-06-16: qty 10

## 2022-06-16 MED ORDER — SODIUM CHLORIDE 0.9 % IR SOLN
Status: DC | PRN
  Administered 2022-06-16: 3000 mL

## 2022-06-16 MED ORDER — CHLORHEXIDINE GLUCONATE 0.12 % MT SOLN
15.0000 mL | Freq: Once | OROMUCOSAL | Status: AC
  Administered 2022-06-16: 15 mL via OROMUCOSAL

## 2022-06-16 MED ORDER — ONDANSETRON HCL 4 MG/2ML IJ SOLN
INTRAMUSCULAR | Status: DC | PRN
  Administered 2022-06-16: 4 mg via INTRAVENOUS

## 2022-06-16 MED ORDER — LIDOCAINE HCL (CARDIAC) PF 50 MG/5ML IV SOSY
PREFILLED_SYRINGE | INTRAVENOUS | Status: DC | PRN
  Administered 2022-06-16: 100 mg via INTRAVENOUS

## 2022-06-16 MED ORDER — OXYCODONE HCL 5 MG PO TABS: 5.0000 mg | ORAL_TABLET | Freq: Once | ORAL | Status: DC | PRN

## 2022-06-16 MED ORDER — ORAL CARE MOUTH RINSE: 15.0000 mL | Freq: Once | OROMUCOSAL | Status: AC

## 2022-06-16 MED ORDER — SODIUM CHLORIDE 0.9 % IV SOLN
2.0000 g | INTRAVENOUS | Status: AC
  Administered 2022-06-16: 2 g via INTRAVENOUS

## 2022-06-16 MED ORDER — OXYCODONE-ACETAMINOPHEN 5-325 MG PO TABS: 1.0000 | ORAL_TABLET | ORAL | 0 refills | Status: AC | PRN

## 2022-06-16 MED ORDER — ROCURONIUM BROMIDE 10 MG/ML (PF) SYRINGE
PREFILLED_SYRINGE | INTRAVENOUS | Status: AC
  Filled 2022-06-16: qty 10

## 2022-06-16 MED ORDER — OXYCODONE HCL 5 MG/5ML PO SOLN: 5.0000 mg | Freq: Once | ORAL | Status: DC | PRN

## 2022-06-16 MED ORDER — INDOCYANINE GREEN 25 MG IV SOLR
2.5000 mg | Freq: Once | INTRAVENOUS | Status: AC
  Administered 2022-06-16: 2.5 mg via INTRAVENOUS

## 2022-06-16 MED ORDER — EPHEDRINE 5 MG/ML INJ
INTRAVENOUS | Status: AC
  Filled 2022-06-16: qty 5

## 2022-06-16 SURGICAL SUPPLY — 39 items
CHLORAPREP W/TINT 26 (MISCELLANEOUS) ×1 IMPLANT
CLIP LIGATING HEM O LOK PURPLE (MISCELLANEOUS) ×1 IMPLANT
COVER MAYO STAND XLG (MISCELLANEOUS) ×1 IMPLANT
COVER TIP SHEARS 8 DVNC (MISCELLANEOUS) ×1 IMPLANT
COVER TIP SHEARS 8MM DA VINCI (MISCELLANEOUS) ×1
DERMABOND ADVANCED .7 DNX12 (GAUZE/BANDAGES/DRESSINGS) ×1 IMPLANT
DRAPE ARM DVNC X/XI (DISPOSABLE) ×4 IMPLANT
DRAPE COLUMN DVNC XI (DISPOSABLE) ×1 IMPLANT
DRAPE DA VINCI XI ARM (DISPOSABLE) ×4
DRAPE DA VINCI XI COLUMN (DISPOSABLE) ×1
DRAPE HALF SHEET 40X57 (DRAPES) ×1 IMPLANT
ELECT REM PT RETURN 9FT ADLT (ELECTROSURGICAL) ×1
ELECTRODE REM PT RTRN 9FT ADLT (ELECTROSURGICAL) ×1 IMPLANT
GLOVE BIOGEL PI IND STRL 7.0 (GLOVE) ×2 IMPLANT
GLOVE SURG SS PI 7.5 STRL IVOR (GLOVE) ×2 IMPLANT
GOWN STRL REUS W/TWL LRG LVL3 (GOWN DISPOSABLE) ×3 IMPLANT
IRRIGATOR SUCT 8 DISP DVNC XI (IRRIGATION / IRRIGATOR) IMPLANT
IRRIGATOR SUCTION 8MM XI DISP (IRRIGATION / IRRIGATOR) ×1
IV NS IRRIG 3000ML ARTHROMATIC (IV SOLUTION) IMPLANT
KIT TURNOVER KIT A (KITS) ×1 IMPLANT
MANIFOLD NEPTUNE II (INSTRUMENTS) ×1 IMPLANT
NDL HYPO 21X1 ECLIPSE (NEEDLE) ×1 IMPLANT
NDL INSUFFLATION 14GA 120MM (NEEDLE) ×1 IMPLANT
NEEDLE HYPO 21X1 ECLIPSE (NEEDLE) ×1 IMPLANT
NEEDLE INSUFFLATION 14GA 120MM (NEEDLE) ×1 IMPLANT
OBTURATOR OPTICAL STANDARD 8MM (TROCAR) ×1
OBTURATOR OPTICAL STND 8 DVNC (TROCAR) ×1
OBTURATOR OPTICALSTD 8 DVNC (TROCAR) ×1 IMPLANT
PACK LAP CHOLE LZT030E (CUSTOM PROCEDURE TRAY) ×1 IMPLANT
PENCIL HANDSWITCHING (ELECTRODE) ×1 IMPLANT
PENCIL SMOKE EVACUATOR (MISCELLANEOUS) ×1 IMPLANT
SEAL CANN UNIV 5-8 DVNC XI (MISCELLANEOUS) ×4 IMPLANT
SEAL XI 5MM-8MM UNIVERSAL (MISCELLANEOUS) ×4
SET TUBE SMOKE EVAC HIGH FLOW (TUBING) ×1 IMPLANT
SUT MNCRL AB 4-0 PS2 18 (SUTURE) ×2 IMPLANT
SUT VICRYL 0 AB UR-6 (SUTURE) ×1 IMPLANT
SYS RETRIEVAL 5MM INZII UNIV (BASKET) ×1
SYSTEM RETRIEVL 5MM INZII UNIV (BASKET) ×1 IMPLANT
WATER STERILE IRR 500ML POUR (IV SOLUTION) ×1 IMPLANT

## 2022-06-16 NOTE — Transfer of Care (Signed)
Immediate Anesthesia Transfer of Care Note  Patient: Rebecca Ewing  Procedure(s) Performed: XI ROBOTIC ASSISTED LAPAROSCOPIC CHOLECYSTECTOMY (Abdomen)  Patient Location: PACU  Anesthesia Type:General  Level of Consciousness: awake  Airway & Oxygen Therapy: Patient Spontanous Breathing  Post-op Assessment: Report given to RN  Post vital signs: Reviewed and stable  Last Vitals:  Vitals Value Taken Time  BP 120/52 06/16/22 1200  Temp    Pulse 89 06/16/22 1203  Resp 17 06/16/22 1203  SpO2 99 % 06/16/22 1203  Vitals shown include unvalidated device data.  Last Pain:  Vitals:   06/16/22 0945  TempSrc: Oral  PainSc: 0-No pain         Complications: No notable events documented.

## 2022-06-16 NOTE — Interval H&P Note (Signed)
History and Physical Interval Note:  06/16/2022 9:49 AM  Rebecca Ewing  has presented today for surgery, with the diagnosis of Cholelithiasis.  The various methods of treatment have been discussed with the patient and family. After consideration of risks, benefits and other options for treatment, the patient has consented to  Procedure(s): XI ROBOTIC [place] (N/A) as a surgical intervention.  The patient's history has been reviewed, patient examined, no change in status, stable for surgery.  I have reviewed the patient's chart and labs.  Questions were answered to the patient's satisfaction.   Dr. Constance Haw is ill, thus I will be doing the procedure.  Patient aware and wishes to proceed.  [name]

## 2022-06-16 NOTE — Op Note (Signed)
Patient:  Rebecca Ewing  DOB:  03-05-1966  MRN:  [id number]   Preop Diagnosis: Biliary colic, cholelithiasis  Postop Diagnosis: Same  Procedure: Robotic assisted laparoscopic cholecystectomy  Surgeon: [name], MD  Anes: General endotracheal  Indications: Patient is a 57 year old Hispanic female who presents with biliary colic secondary to cholelithiasis.  The risks and benefits of the procedure including bleeding, infection, hepatobiliary injury, and the possibility of an open procedure were fully explained to the patient, who gave informed consent.  Procedure note: The patient was placed in the supine position.  After induction of general endotracheal anesthesia, the abdomen was prepped and draped using the usual sterile technique with ChloraPrep.  Surgical site confirmation was performed.  An infraumbilical incision was made down to the fascia.  A Veress needle was introduced into the abdominal cavity and confirmation of placement was done using the saline drop test.  The abdomen was then insufflated to 15 mmHg pressure.  An 8 mm trocar was introduced into the abdominal cavity under direct visualization without difficulty.  The patient was placed in reverse Trendelenburg position and additional 8 mm trocars were placed in the right flank, right abdomen, and left upper quadrant regions.  The robot was then targeted and docked.  The gallbladder wall appeared to be thin.  There was a stone within the gallbladder.  The gallbladder was retracted in a dynamic fashion in order to provide a critical view of the triangle of Calot.  The cystic duct was first identified.  Its junction to the infundibulum was fully identified.  This was confirmed by firefly.  Hem-o-lok clips were placed proximally distally on the cystic duct and the cystic duct was divided.  This was likewise done and cystic artery.  The gallbladder was freed away from the gallbladder fossa using Bovie electrocautery.  The  gallbladder was delivered through the Endo Catch bag without difficulty.  The right upper quadrant was copiously irrigated normal saline.  No abnormal bleeding or bile leakage was noted at the end of the procedure.  All fluid and air were then evacuated from the abdominal cavity prior to removal of the trocars.  The robot was undocked.  All incisions were irrigated with normal saline.  All incisions were injected with Exparel.  The left upper quadrant fascia was reapproximated using an 0 Vicryl interrupted suture.  All skin incisions were closed using a 4-0 Monocryl subcuticular suture.  Dermabond was applied.  All tape and needle counts were correct at the end of the procedure.  The patient was extubated in the operating room and transferred to PACU in stable condition.  Complications: None  EBL: Minimal  Specimen: Gallbladder

## 2022-06-16 NOTE — Anesthesia Procedure Notes (Signed)
Procedure Name: Intubation Date/Time: 06/16/2022 10:40 AM  Performed by: Vista Deck, CRNAPre-anesthesia Checklist: Patient identified, Patient being monitored, Timeout performed, Emergency Drugs available and Suction available Patient Re-evaluated:Patient Re-evaluated prior to induction Oxygen Delivery Method: Circle system utilized Preoxygenation: Pre-oxygenation with 100% oxygen Induction Type: IV induction Ventilation: Mask ventilation without difficulty Laryngoscope Size: Mac and 3 Grade View: Grade I Tube type: Oral Tube size: 7.0 mm Number of attempts: 1 Airway Equipment and Method: Stylet Placement Confirmation: ETT inserted through vocal cords under direct vision, positive ETCO2 and breath sounds checked- equal and bilateral Secured at: 21 cm Tube secured with: Tape Dental Injury: Teeth and Oropharynx as per pre-operative assessment

## 2022-06-16 NOTE — Progress Notes (Signed)
[id number] [place] interpreter used in post-op

## 2022-06-16 NOTE — Op Note (Incomplete)
Rebecca Ewing [phone]

## 2022-06-16 NOTE — Anesthesia Preprocedure Evaluation (Signed)
Anesthesia Evaluation  Patient identified by MRN, date of birth, ID band Patient awake    Reviewed: Allergy & Precautions, H&P , NPO status , Patient's Chart, lab work & pertinent test results, reviewed documented beta blocker date and time   Airway Mallampati: II  TM Distance: >3 FB Neck ROM: full    Dental no notable dental hx.    Pulmonary neg pulmonary ROS   Pulmonary exam normal breath sounds clear to auscultation       Cardiovascular Exercise Tolerance: Good hypertension, negative cardio ROS  Rhythm:regular Rate:Normal     Neuro/Psych negative neurological ROS  negative psych ROS   GI/Hepatic negative GI ROS, Neg liver ROS,GERD  ,,  Endo/Other  negative endocrine ROSdiabetes, Type 2    Renal/GU negative Renal ROS  negative genitourinary   Musculoskeletal   Abdominal   Peds  Hematology negative hematology ROS (+)   Anesthesia Other Findings   Reproductive/Obstetrics negative OB ROS                             Anesthesia Physical Anesthesia Plan  ASA: 2  Anesthesia Plan: General and General ETT   Post-op Pain Management:    Induction:   PONV Risk Score and Plan: Ondansetron  Airway Management Planned:   Additional Equipment:   Intra-op Plan:   Post-operative Plan:   Informed Consent: I have reviewed the patients History and Physical, chart, labs and discussed the procedure including the risks, benefits and alternatives for the proposed anesthesia with the patient or authorized representative who has indicated his/her understanding and acceptance.     Dental Advisory Given  Plan Discussed with: CRNA  Anesthesia Plan Comments:        Anesthesia Quick Evaluation

## 2022-06-17 LAB — SURGICAL PATHOLOGY

## 2022-06-17 NOTE — Anesthesia Postprocedure Evaluation (Signed)
Anesthesia Post Note  Patient: Rebecca Ewing  Procedure(s) Performed: XI ROBOTIC ASSISTED LAPAROSCOPIC CHOLECYSTECTOMY (Abdomen)  Patient location during evaluation: Phase II Anesthesia Type: General Level of consciousness: awake Pain management: pain level controlled Vital Signs Assessment: post-procedure vital signs reviewed and stable Respiratory status: spontaneous breathing and respiratory function stable Cardiovascular status: blood pressure returned to baseline and stable Postop Assessment: no headache and no apparent nausea or vomiting Anesthetic complications: no Comments: Late entry   No notable events documented.   Last Vitals:  Vitals:   06/16/22 1300 06/16/22 1311  BP: (!) 94/55 130/72  Pulse: 98 96  Resp: 16 16  Temp:  36.5 C  SpO2: 99% 100%    Last Pain:  Vitals:   06/16/22 1311  TempSrc: Oral  PainSc: 0-No pain                 Louann Sjogren

## 2022-06-26 ENCOUNTER — Ambulatory Visit (INDEPENDENT_AMBULATORY_CARE_PROVIDER_SITE_OTHER): Payer: Self-pay | Admitting: General Surgery

## 2022-06-26 DIAGNOSIS — Z09 Encounter for follow-up examination after completed treatment for conditions other than malignant neoplasm: Secondary | ICD-10-CM

## 2022-06-27 ENCOUNTER — Encounter: Payer: Self-pay | Admitting: General Surgery

## 2022-06-27 NOTE — Progress Notes (Signed)
Postoperative telephone visit performed with patient's husband.  Patient does not speak [place].  Patient's husband says she is doing well.  Her nausea is resolving from the anesthesia.  Her preoperative symptoms have resolved.  She has not had any fever or chills.  I told him to have her return to my office should any problems arise.  As this was a part of the total global surgical fee, this was not a billable visit.  Total telephone time was 2 minutes.

## 2023-07-21 ENCOUNTER — Telehealth: Payer: Self-pay

## 2023-07-21 NOTE — Telephone Encounter (Signed)
 Attempted wellness call of [facility] client. No answer, interpreter utilized. Unable to leave message. Also sent text message in Spanish to contact our office to learn more about our services.  Her next appointment with [facility] is scheduled for 08/20/23   Her last A1C 6.4 02/26/23.  Francee Nodal RN [employer]

## 2024-02-29 ENCOUNTER — Telehealth: Payer: Self-pay

## 2024-02-29 NOTE — Telephone Encounter (Signed)
 Attempted to follow up with [facility] client with interpreter services. No answer, left voicemail.  [facility] with [facility] and last seen on 02/18/24 A1C at that visit was 7.1 Her next visit is 05/19/24  Will continue to attempt to connect with client for any needed resources and diabetic education and support.   Avelina JONELLE Skeen RN [employer]
